# Patient Record
Sex: Male | Born: 1935 | Race: White | Hispanic: No | Marital: Married | Smoking: Never smoker
Health system: Southern US, Community
[De-identification: ages and names within clinical notes are randomized; demographics above are authoritative.]

## PROBLEM LIST (undated history)

## (undated) DIAGNOSIS — E119 Type 2 diabetes mellitus without complications: Secondary | ICD-10-CM

## (undated) DIAGNOSIS — I10 Essential (primary) hypertension: Secondary | ICD-10-CM

## (undated) DIAGNOSIS — E785 Hyperlipidemia, unspecified: Secondary | ICD-10-CM

## (undated) DIAGNOSIS — I2510 Atherosclerotic heart disease of native coronary artery without angina pectoris: Secondary | ICD-10-CM

## (undated) HISTORY — PX: CARDIAC SURGERY: SHX584

## (undated) HISTORY — PX: APPENDECTOMY: SHX54

## (undated) HISTORY — PX: INGUINAL HERNIA REPAIR: SHX194

---

## 1999-07-27 ENCOUNTER — Encounter: Payer: Self-pay | Admitting: Internal Medicine

## 1999-07-27 ENCOUNTER — Encounter: Admission: RE | Admit: 1999-07-27 | Discharge: 1999-07-27 | Payer: Self-pay | Admitting: Internal Medicine

## 2001-04-22 ENCOUNTER — Ambulatory Visit: Admission: RE | Admit: 2001-04-22 | Discharge: 2001-04-22 | Payer: Self-pay | Admitting: Specialist

## 2001-04-22 ENCOUNTER — Encounter: Payer: Self-pay | Admitting: Specialist

## 2001-10-26 ENCOUNTER — Encounter (INDEPENDENT_AMBULATORY_CARE_PROVIDER_SITE_OTHER): Payer: Self-pay | Admitting: *Deleted

## 2001-10-26 ENCOUNTER — Ambulatory Visit (HOSPITAL_BASED_OUTPATIENT_CLINIC_OR_DEPARTMENT_OTHER): Admission: RE | Admit: 2001-10-26 | Discharge: 2001-10-26 | Payer: Self-pay | Admitting: Otolaryngology

## 2002-06-04 ENCOUNTER — Encounter: Admission: RE | Admit: 2002-06-04 | Discharge: 2002-06-04 | Payer: Self-pay | Admitting: Gastroenterology

## 2002-06-04 ENCOUNTER — Encounter: Payer: Self-pay | Admitting: Gastroenterology

## 2002-06-28 ENCOUNTER — Encounter: Payer: Self-pay | Admitting: Gastroenterology

## 2002-06-28 ENCOUNTER — Ambulatory Visit (HOSPITAL_COMMUNITY): Admission: RE | Admit: 2002-06-28 | Discharge: 2002-06-28 | Payer: Self-pay | Admitting: Gastroenterology

## 2002-08-06 ENCOUNTER — Encounter: Payer: Self-pay | Admitting: Emergency Medicine

## 2002-08-06 ENCOUNTER — Emergency Department (HOSPITAL_COMMUNITY): Admission: EM | Admit: 2002-08-06 | Discharge: 2002-08-06 | Payer: Self-pay | Admitting: Physical Therapy

## 2003-05-15 ENCOUNTER — Inpatient Hospital Stay (HOSPITAL_COMMUNITY): Admission: EM | Admit: 2003-05-15 | Discharge: 2003-05-19 | Payer: Self-pay | Admitting: Emergency Medicine

## 2003-06-13 ENCOUNTER — Encounter (HOSPITAL_COMMUNITY): Admission: RE | Admit: 2003-06-13 | Discharge: 2003-09-11 | Payer: Self-pay | Admitting: Interventional Cardiology

## 2004-02-01 ENCOUNTER — Ambulatory Visit: Payer: Self-pay | Admitting: Cardiology

## 2004-05-02 ENCOUNTER — Ambulatory Visit: Payer: Self-pay | Admitting: Cardiology

## 2004-08-01 ENCOUNTER — Ambulatory Visit: Payer: Self-pay | Admitting: Cardiology

## 2005-01-30 ENCOUNTER — Ambulatory Visit (HOSPITAL_COMMUNITY): Admission: RE | Admit: 2005-01-30 | Discharge: 2005-01-30 | Payer: Self-pay | Admitting: Interventional Cardiology

## 2009-07-12 ENCOUNTER — Ambulatory Visit (HOSPITAL_COMMUNITY): Admission: RE | Admit: 2009-07-12 | Discharge: 2009-07-12 | Payer: Self-pay | Admitting: General Surgery

## 2010-03-25 LAB — DIFFERENTIAL
Basophils Absolute: 0 10*3/uL (ref 0.0–0.1)
Basophils Relative: 0 % (ref 0–1)
Monocytes Absolute: 0.7 10*3/uL (ref 0.1–1.0)
Monocytes Relative: 7 % (ref 3–12)
Neutro Abs: 6.9 10*3/uL (ref 1.7–7.7)
Neutrophils Relative %: 70 % (ref 43–77)

## 2010-03-25 LAB — URINALYSIS, ROUTINE W REFLEX MICROSCOPIC
Nitrite: NEGATIVE
Urobilinogen, UA: 1 mg/dL (ref 0.0–1.0)

## 2010-03-25 LAB — GLUCOSE, CAPILLARY
Glucose-Capillary: 148 mg/dL — ABNORMAL HIGH (ref 70–99)
Glucose-Capillary: 176 mg/dL — ABNORMAL HIGH (ref 70–99)

## 2010-03-25 LAB — SURGICAL PCR SCREEN
MRSA, PCR: NEGATIVE
Staphylococcus aureus: NEGATIVE

## 2010-03-25 LAB — CBC
HCT: 46.9 % (ref 39.0–52.0)
Hemoglobin: 16.4 g/dL (ref 13.0–17.0)
MCHC: 34.9 g/dL (ref 30.0–36.0)
MCV: 92.1 fL (ref 78.0–100.0)
RBC: 5.09 MIL/uL (ref 4.22–5.81)
RDW: 13.9 % (ref 11.5–15.5)

## 2010-03-25 LAB — COMPREHENSIVE METABOLIC PANEL
ALT: 27 U/L (ref 0–53)
AST: 22 U/L (ref 0–37)
BUN: 13 mg/dL (ref 6–23)
Calcium: 9.6 mg/dL (ref 8.4–10.5)
Chloride: 101 mEq/L (ref 96–112)
Creatinine, Ser: 0.87 mg/dL (ref 0.4–1.5)
GFR calc non Af Amer: >60 mL/min (ref >60)
Potassium: 4.2 mEq/L (ref 3.5–5.1)
Total Bilirubin: 1.2 mg/dL (ref 0.3–1.2)

## 2010-05-25 NOTE — Cardiovascular Report (Signed)
NAME:  Alan Miranda, Alan Miranda                              ACCOUNT NO.:  [id number]   MEDICAL RECORD NO.:  [id number]                   PATIENT TYPE:  INP   LOCATION:  2912                                 FACILITY:  [facility]   PHYSICIAN:  Lesleigh Noe, M.D.            DATE OF BIRTH:  [birth year]   DATE OF PROCEDURE:  05/16/2003  DATE OF DISCHARGE:                              CARDIAC CATHETERIZATION   INDICATIONS FOR PROCEDURE:  Recent anterior Q-wave myocardial infarction  within the past 72 hours, persistent ongoing chest discomfort.   DATE OF PROCEDURE:  May 16, 2003.   PROCEDURE PERFORMED:  1. Left heart catheterization.  2. Selective coronary angiography.  3. Left ventriculography.  4. Stent LAD with Cypher drug-eluting stent.   DESCRIPTION:  After informed consent, a 6-French sheath was placed in the  right femoral artery using modified Seldinger technique.  A 6-French A2  multipurpose catheter was used for left ventriculography by hand injection,  selective right coronary angiography and hemodynamic recordings.  A 4 left  Judkins catheter was used for left coronary angiography.  We identified  total occlusion of the mid LAD after a large first diagonal and there was  faint right-to-left and left-to-left collaterals to the distal LAD  circulatory bed.  The ventriculogram demonstrated essential akinesis of the  apex.  With mild ongoing chest pain, we decided to intervene on the LAD.   We ultimately used a 4.0 Q-shaped 6-French left coronary guide catheter, and  IV integrilin double bolus followed by an infusion.  We also used 5000 units  of IV heparin to document an ACT greater than 250 seconds.   We had difficulty crossing the total occlusion in the LAD.  An Asahi soft  wire was initially used.  Then, an Office manager was used.  We were  eventually able to cross into a diagonal beyond the region of total  occlusion.  We were never able to get into the main channel of the LAD.   We  did a 2.0 mm diameter balloon inflation within the region of the diagonal  LAD bifurcation and this essentially opened the vessel and gave [place] minimal  flow down the LAD.  We were then easily able to reposition the wire distally  in the LAD.  We predilated with the 2.0 balloon, and then were able to stent  the LAD with an 18 x 3.0 mm Cypher stent/balloon system to 14 atmospheres.  Two balloon inflations were performed.  Antegrade TIMI-3 flow was noted post  intervention.  The patient tolerated the procedure without complications.   Post procedure Angio-Seal arteriotomy closure was performed after  visualization of the right femoral/iliac demonstrating adequate access entry  site.  Good hemostasis was achieved.   RESULTS:   I. HEMODYNAMIC DATA:  A.  Aortic pressure 104/70.  B.  Left ventricular pressure 104/14.   II. LEFT VENTRICULOGRAPHY:  Apical  akinesis.  EF 50%.   III. CORONARY ANGIOGRAPHY:  A.  Left main coronary:  Normal.  B.  Left anterior descending coronary:  Totally occluded after the first  diagonal.  First diagonal contains 60% mid vessel stenosis.  C.  Circumflex artery:  One dominant obtuse marginal arises from the  circumflex.  There is 40% mid vessel stenosis.  D.  Right coronary:  There is 20-30% narrowing in the mid and distal  coronary.  PDA is large.  Collaterals to the LAD are noted from the PDA,  particularly through a mid septal perforator.   IV. PERCUTANEOUS CORONARY INTERVENTION:  The LAD is 100% occluded after the  first diagonal.  After percutaneous intervention and stenting with 18 x 3.0  Cypher, the lesion in the LAD was reduced to 0% and there was TIMI-3 flow.   CONCLUSIONS:  1. Recent anteroapical myocardial infarction with left ventricular ejection     fraction of 40-50%.  2. 100% mid left anterior descending reduced to 0% following successful     stent with Cypher drug-eluting stent.  The left anterior descending was     collateralized by  left-to-right and right-to-left collaterals.  3. Luminal irregularities with up to 40% stenosis noted in the right     coronary and 60% stenosis noted in the first obtuse marginal.   PLAN:  1. Aspirin.  2. The patient is enrolled in [place].  3. Integrilin x 12 hours.  4. Hopeful discharge within the next 48 hours.                                               Lesleigh Noe, M.D.    HWS/MEDQ  D:  05/16/2003  T:  05/16/2003  Job:  [id number]   cc:   Theressa Millard, M.D.  [address] [place]  [place] [address]  Fax: [phone]

## 2010-05-25 NOTE — Op Note (Signed)
   NAME:  Alan Miranda, Alan Miranda                              ACCOUNT NO.:  [id number]   MEDICAL RECORD NO.:  [id number]                   PATIENT TYPE:  AMB   LOCATION:  DSC                                  FACILITY:  [facility]   PHYSICIAN:  Christopher E. Ezzard Standing, M.D.         DATE OF BIRTH:  1934/02/29   DATE OF PROCEDURE:  10/26/2001  DATE OF DISCHARGE:                                 OPERATIVE REPORT   PREOPERATIVE DIAGNOSES:  Left buccal mass (2.0 cm in size).   POSTOPERATIVE DIAGNOSES:  Left buccal mas (2.0 cm in size).   OPERATION:  Excision of left buccal mass with a simple closure.   SURGEON:  Kristine Garbe. Ezzard Standing, M.D.   ANESTHESIA:  Local 1% Xylocaine with 1:100,000 epinephrine.   COMPLICATIONS:  None.   INDICATIONS FOR PROCEDURE:  The patient is a 76 year old gentleman who has  had a left buccal mass for several years.  It has gradually gotten a little  bit larger.  It interferes when he chews.  He says that he occasionally  bites it.  On examination he has a smooth mucosal polypoid mass arising just  inferior to the left parotid duct.  It measures approximately 2.0 cm in  size.  He is taken to the operating room at this time for an excision under  local anesthetic.   DESCRIPTION OF PROCEDURE:  The area was injected with 2 cc of Xylocaine with  epinephrine.  The mass was excised at its base.  It was sent to pathology.  Silver nitrate was applied for hemostasis.  The defect was closed with three  interrupted #5-0 chromic sutures.  The excision site was 0.5 cm below the  parotid duct, which was visible and tightened.   DISPOSITION:  The patient is discharged home.  The patient will call our  office in three to four days concerning the results of the pathology.  I  instructed the patient to use Tylenol p.r.n. for pain.                                                Kristine Garbe. Ezzard Standing, M.D.    CEN/MEDQ  D:  10/26/2001  T:  10/26/2001  Job:  [id number]

## 2010-05-25 NOTE — H&P (Signed)
NAME:  Alan Miranda, Alan Miranda                              ACCOUNT NO.:  [id number]   MEDICAL RECORD NO.:  [id number]                   PATIENT TYPE:  EMS   LOCATION:  MAJO                                 FACILITY:  [facility]   PHYSICIAN:  Cassell Clement, M.D.              DATE OF BIRTH:  1934-04-26   DATE OF ADMISSION:  05/15/2003  DATE OF DISCHARGE:                                HISTORY & PHYSICAL   CHIEF COMPLAINT:  Chest pain.   HISTORY:  This is a 76 year old Caucasian male without prior known history  of heart disease. He was admitted with a history of frequent chest pains  over the past one to two weeks. He had his worse spell last night after  supper. He has a history of known reflux and has had esophageal strictures  dilated by  Dr. Danise Edge and he had been attributing his symptoms of  indigestion to his hiatal hernia and reflux. Last night he had substernal  pressure as well as epigastric fullness. He was noted to have pallor,  nausea, and diaphoresis. There was no left arm radiation. He does have  occasional radiation of discomfort in the right arm. Today he continued not  to feel well. He did not go to church, which is quite unusual for him not to  go to church. His family convinced him to come to the emergency room to be  checked and in the emergency room he was initially being evaluated for  possible gallbladder and gallbladder ultrasound had been requested, but then  the electrocardiogram came back showing Q-waves in the inferior leads and in  leads v3 and v4 and point of care enzymes returned markedly elevated and it  was recognized that these symptoms were probably a cardiac rather than  gastric. He has noted in retrospect that twice within the past week or two  he went to play golf and had to quit after just three or four holes because  of noted chest tightness. He did not seek any medical help for these  symptoms, however. The patient does have a history of borderline  hypertension and has been on low-dose hydrochlorothiazide 25 mg once a day  and he states his cholesterol has never been a problem, but he has had  elevated triglycerides. He is not diabetic.   FAMILY HISTORY:  His mother is still living at age 41, but has had a  coronary stent. Father died at 83 of emphysema and an aneurysm in the chest,  but no clear as to whether it was cardiac or aortic. He has a brother who is  living and has had a small heart attack.   SOCIAL HISTORY:  Retired from [employer] in 1991. He is a nonsmoker, but does  chew tobacco. He does not use alcohol.   ALLERGIES:  Tetracycline.   PAST SURGICAL HISTORY:  He has had a right inguinal  herniorrhaphy,  appendectomy, and he has had kidney stones.   PRESENT MEDICATIONS AT HOME:  1. Clarinex 5 mg daily.  2. Prilosec once a day.  3. Xanax 0.25 mg p.r.n. anxiety.  4. Ibuprofen p.r.n. arthritis.  5. Hydrochlorothiazide 25 mg daily.  6. He does not take any regular aspirin, but did take some aspirin last     night when he had his symptoms.   REVIEW OF SYSTEMS:  He has recently been involved in an evaluation of  vertigo initially by Dr. Lovey Newcomer and then by Dr. Kelli Hope and more  recently by the neurosurgeon, Dr. Venetia Maxon. No surgery is contemplated  immediately, but they have spoken to him about going to a rehab program for  vertigo. GASTROINTESTINAL:  Frequent indigestion.  GENITOURINARY:  Negative.  RESPIRATORY:  Occasional cough with some __________.   PHYSICAL EXAMINATION:  VITAL SIGNS:  Blood pressure 130/61, pulse 69 and  regular, respirations normal.  GENERAL:  He is a well-developed, well-nourished white male in no distress  and not having any significant pain now. He is lying flat and comfortable.  SKIN:  Warm and dry. Color was good.  HEENT:  The pupils are equal and reactive. Sclerae clear. Mouth and pharynx  normal.  NECK:  Carotids normal. Jugular vein pressure normal. No lymphadenopathy.  Thyroid not  enlarged.  CHEST:  Clear to auscultation.  The heart reveals a quiet precardium without  murmur, gallop, rub, or click.  ABDOMEN:  Soft and nontender.  EXTREMITIES:  No phlebitis or edema. He has good peripheral pulses.   LABORATORY DATA:  Chest x-ray shows no active disease. EKG shows normal  sinus rhythm at 74 per minute. He has Q-waves in leads 2, 3, and AVF  consistent with an old inferior wall MI. He also has a QS pattern in v3, v4,  consistent with anterior wall MI of uncertain age.   Initial labs:  Hemoglobin 17.5, white count 16,000 with 83% polys. Point of  care CK-MB is greater than 80. Point of care troponin I is greater than 30.  Point of care myoglobin is 368. His SGOT is 207, SGPT is 69. Alkaline  phosphatase is normal at 58. Bilirubin 1.4. Sodium 137, potassium 3.5, BUN  9, creatinine 0.9.   IMPRESSION:  1. Ischemic heart disease with recent myocardial infarction by enzymes. EKG     suggests old inferior MI and also anterior MI of uncertain age. This is     superimposed upon a history of recent crescendo angina for the past     several weeks.  2. Essential hypertension.  3. Tobacco usage (chewing tobacco).  4. GERD.  5. History of anxiety.   DISPOSITION:  We are admitting to Dr. Verdis Prime to a telemetry bed. We  will get serial enzymes and EKGs. We will treat with IV heparin, IV  nitroglycerin, Lopressor, Lipitor, aspirin. We will replete his potassium.  Anticipate probable cardiac catheterization Monday by Dr. Verdis Prime.                                                Cassell Clement, M.D.    TB/MEDQ  D:  05/15/2003  T:  05/15/2003  Job:  [id number]   cc:   Theressa Millard, M.D.  [address] [place]  [place] [address]  Fax: [phone]   Lyn Records III,  M.D.  [address]  [place]  [place] [address]  Fax: [phone]

## 2010-05-25 NOTE — Discharge Summary (Signed)
NAME:  Alan Miranda, Alan Miranda                              ACCOUNT NO.:  [id number]   MEDICAL RECORD NO.:  [id number]                   PATIENT TYPE:  INP   LOCATION:  2017                                 FACILITY:  [facility]   PHYSICIAN:  Revonda Standard L. Rennis Harding, N.P.              DATE OF BIRTH:  [birth year]   DATE OF ADMISSION:  05/15/2003  DATE OF DISCHARGE:  05/19/2003                                 DISCHARGE SUMMARY   CHIEF COMPLAINT/REASON FOR ADMISSION:  Mr. Sebring is a 76 year old male  without any prior history of known cardiac disease who complained of a  history of frequent chest pain over one to two weeks, significant spells the  evening before admission after dinner. He has a known history of reflux and  esophageal strictures, dilated by Dr. Laural Benes, and has been attributing  these chest discomfort symptoms to GI etiology. He began having substernal  chest pressure as well as epigastric fullness with the previous night's  chest discomfort and he also began to have new symptoms of pallor, nausea,  and diaphoresis. There was no radicular pain from the chest to the left arm,  although he does have occasional radiation or discomfort to the right arm.  He felt poorly in that he was unable to go to the church on the date of  admission.  Because of this the family became concerned and convinced him to  come to the emergency department. He was initially evaluated for possible  gallbladder disease, but routine EKG came back showing Q-waves in the  inferior leads as well as in V3 and V4 with point of care enzymes returning  markedly elevated. The patient also noted in retrospect that over the past  week or two when he went to play golf he had to quit after three to four  holes because of chest tightness.  He does have a history of hypertension on  low-dose hydrochlorothiazide. He states that his cholesterol has been okay,  but his triglycerides are elevated.   FAMILY HISTORY:  Significant for mother with a  coronary stent; she is 37.  Father without any coronary disease. A brother who has had a heart attack.   On initial evaluation the patient's vital signs were stable. Blood pressure  130/61, pulse 69 and regular, respirations normal and nonlabored. Physical  examination was otherwise normal.  Chest x-ray was normal. EKG showed sinus  rhythm, Q-waves in leads II through aVF consistent with old inferior wall MI  as well as a QS pattern in V3 and V4 consistent with anterior wall MI of  uncertain age. Initial hemoglobin 17.5, white count 16,000. Point of care CK  greater than 80, point of care troponin-I greater than 30, point of care  myoglobin 368.  SGOT 207, SGPT 69, alkaline phosphatase normal at 58, total  bilirubin 1.4, sodium 137, potassium 3.5, BUN 9, creatinine 0.9.   The patient  was admitted by Dr. Patty Sermons with the following diagnoses: (1)  Acute MI with evidence of old inferior MI per EKG. (2) Elevated cardiac  isoenzymes suggesting that MI has been in progress greater than eight hours.  (3) Recent crescendo angina for several weeks. (4) Essential hypertension.  (5) Oral tobacco use. (6) GERD. (7) History of anxiety.   HOSPITAL COURSE:   PROBLEM #1:  Acute MI. The patient was admitted to a general telemetry unit  where he was started on aspirin, heparin, and beta blockers as well as IV  nitroglycerin. His potassium was somewhat low as noted by Dr. Patty Sermons, so  the potassium was repleted orally. He was empirically placed on a cath  schedule per Dr. Katrinka Blazing for the following morning. He also spiked a  temperature during the night of 101.4 and although this could be related to  his recent MI, Dr. Katrinka Blazing also wanted to rule out any infectious process. He  did check a urinalysis and culture.  The patient later that day, on May 16, 2003, was taken to the cath lab per Dr. Katrinka Blazing. The LAD was noted to be 100%  occluded after the diagonal-1. The diagonal-1 was 60% occluded. The   circumflex at the OM-1 was 40% mid lesion. The RCA had a 20% to 30% mid  lesion with a dominant vessel. The patient subsequently underwent PCI of the  LAD with placement of a Cypher stent with AngioSeal to the right groin post  procedure. Good hemostasis. No hematoma. No problems in the immediate post  period.  Ventriculography revealed an EF of 45% to 55% and evidence of  anteroapical MI. The patient was also started on the Triton study drug and  aspirin was continued in the immediate post catheterization period. The  following day he continued to have some chest soreness, but no true cardiac  chest pain as previously experienced. He had some bruising at the right  groin cath site; otherwise, cath site was negative. He was also experiencing  some cough, but chest x-ray was negative for CHF or pneumonia, and there was  some concern with the fever that the patient may also be experiencing some  pericarditis, but he was not having any typical EKG changes with that. By  May 18, 2003, the patient was feeling nervous and jittery. He did not sleep  well. Blood pressure was stable. He continued to run low-grade fevers of  100.2, which was his T-max.  Due to his anxiety, Dr. Katrinka Blazing started some  Xanax and cardiac rehab was initiated. By May 19, 2003, the patient's fever  had resolved. He had no more chest pain and he was deemed stable for  discharge.   PROBLEM #2:  Fever. Again, routine workup of fever revealed no infectious  etiology.  Chest x-ray was negative.  Urinalysis was normal and EKG was  negative for any evidence of acute pericarditis and chest pain, and fever  resolved prior to discharge.   PROBLEM #3:  Dyslipidemia. The patient did have a fasting lipid panel drawn  on admission, but I am unable to locate these results on the chart at this  time.   Laboratory values this hospitalization include repeat cardiac isoenzymes as follows: Troponin 65.52, CK 1045, MB 38.  Last CBC was on May 17, 2003,  hemoglobin 13.7, hematocrit 39.5, white count 12,700, platelet count  250,000. BMET revealed sodium of 134, potassium 3.8, chloride 99, CO2 28,  BUN 13, creatinine 1.0, glucose 126.   FINAL  DISCHARGE DIAGNOSES:  1. Status post acute anterior myocardial infarction, status post PCI and     Cypher stent to the left anterior descending.  2. History of remote inferior myocardial infarction as evidenced by     electrocardiogram changes.  3. Dyslipidemia.  4. Gastroesophageal reflux disease.  5. Fever probably secondary to myocardial infarction.  6. Known elevated triglycerides, currently on Lipitor. Please refer to     dyslipidemia.  7. Hypertension, controlled.   DISCHARGE MEDICATIONS:  1. Triton study two tablets every morning.  2. Stop hydrochlorothiazide.  3. Altace 2.5 mg daily.  4. Lopressor 25 mg b.i.d.  5. Aspirin 81 mg daily.  6. Lipitor 80 mg daily.  7. Protonix, Claritin, and Xanax as before.   ACTIVITY:  Begin phase II cardiac rehab.   DIET:  Cardiac.   SPECIAL INSTRUCTIONS:  Do not take open label Plavix while taking Triton  Study drug. The research staff will call the patient to schedule first  visit.   FOLLOWUP APPOINTMENT:  With Dr. Katrinka Blazing on Wednesday, Jun 01, 2003, at 10:15  a.m.; fasting statin panel which means do not eat or drink anything after  midnight the night labs on Thursday, June 30, 2003. At 7:30 a.m.                                                Allison L. Rennis Harding, N.P.    ALE/MEDQ  D:  06/15/2003  T:  06/16/2003  Job:  [id number]   cc:   Theressa Millard, M.D.  [address] [place]  [place] [address]  Fax: [phone]

## 2010-05-25 NOTE — Op Note (Signed)
NAME:  Alan Miranda, Alan Miranda                              ACCOUNT NO.:  [id number]   MEDICAL RECORD NO.:  [id number]                   PATIENT TYPE:  AMB   LOCATION:  ENDO                                 [facility]ACILITY[facility]   PHYSICIAN:  Danise Edge, M.D.                DATE OF BIRTH:  1934-11-23   DATE OF PROCEDURE:  06/28/2002  DATE OF DISCHARGE:                                 OPERATIVE REPORT   PROCEDURE:  Esophagogastroduodenoscopy with Savary esophageal dilation.   PROCEDURE INDICATION:  Mr. Alan Miranda is a 76 year old male born December 23, 1934.  Mr. Read has chronic gastroesophageal reflux associated with a hiatal  hernia documented by barium swallow x-ray.  He has mild odynophagia and  solid food dysphagia.  He denies vomiting or gastrointestinal bleeding.   CHRONIC MEDICATIONS:  Xanax, multivitamin, Advil, Clarinex,  hydrochlorothiazide, Astelin, Zantac, Prilosec.   PAST MEDICAL HISTORY:  1. Allergic rhinitis.  2. Agoraphobia.  3. Gastroesophageal reflux disease associated with a hiatal hernia.  4. Mucocele, left cheek.  5. Arthroscopic knee surgery.  6. Hypertension.  7. Left inguinal hernia.  8. Right inguinal hernia repair.  9. Appendectomy.  10.      Kidney stones.   ENDOSCOPIST:  Danise Edge, M.D.   PREMEDICATION:  Versed 10 mg, Demerol 50 mg.   PROCEDURE:  After obtaining informed consent, [name] was placed in the  left lateral decubitus position.  I administered intravenous Demerol and  intravenous Versed to achieve conscious sedation for the procedure.  The  patient's blood pressure, oxygen saturation, and cardiac rhythm were  monitored throughout the procedure and documented in the medical record.   The Olympus gastroscope was passed through the posterior hypopharynx into  the proximal esophagus without difficulty.  The hypopharynx, larynx, and  vocal cords appeared normal.   Esophagoscopy:  The proximal and midsegments of the esophageal mucosa  appeared  normal.  There is a benign-appearing stricture at the  esophagogastric junction unassociated with erosive esophagitis or Barrett's  esophagus.   Gastroscopy:  There is a small hiatal hernia.  Retroflexed view of the  gastric cardia and fundus was normal.  The diaphragmatic hiatus is quite  patulous.  The gastric body, antrum, and pylorus appear normal.   Duodenoscopy:  The duodenal bulb, mid-duodenum, and distal duodenum appear  normal.   Savary esophageal dilation:  The Savary dilator wire was passed through the  endoscope and the tip of the guidewire advanced to the distal gastric  antrum, as confirmed endoscopically and fluoroscopically.  Under  fluoroscopic guidance a 15 mm Savary dilator passed without resistance.  Repeat esophagogastroscopy confirmed satisfactory dilation of the benign  peptic stricture at the esophagogastric junction and no endoscopic evidence  for the presence of gastric trauma secondary to the guidewire.   ASSESSMENT:  Chronic gastroesophageal reflux associated with a hiatal hernia  and benign peptic stricture at  the esophagogastric junction, dilated with a  15 mm Savary dilator.   RECOMMENDATIONS:  1. Continue Prilosec 20 mg each morning and p.r.n. Zantac in the evening.  2. Repeat esophageal dilation as needed.                                               Danise Edge, M.D.    MJ/MEDQ  D:  06/28/2002  T:  06/29/2002  Job:  [id number]

## 2015-09-20 ENCOUNTER — Other Ambulatory Visit: Payer: Self-pay

## 2015-09-20 ENCOUNTER — Other Ambulatory Visit: Payer: Self-pay | Admitting: Interventional Cardiology

## 2015-09-20 ENCOUNTER — Telehealth: Payer: Self-pay

## 2015-09-20 DIAGNOSIS — I5020 Unspecified systolic (congestive) heart failure: Secondary | ICD-10-CM

## 2015-09-20 DIAGNOSIS — R110 Nausea: Secondary | ICD-10-CM

## 2015-09-20 DIAGNOSIS — R630 Anorexia: Secondary | ICD-10-CM

## 2015-09-20 DIAGNOSIS — M7989 Other specified soft tissue disorders: Secondary | ICD-10-CM

## 2015-09-20 DIAGNOSIS — I2510 Atherosclerotic heart disease of native coronary artery without angina pectoris: Secondary | ICD-10-CM

## 2015-09-20 NOTE — Telephone Encounter (Signed)
Dr. Chales SalmonVaradarajan with [facility] called and talked to Dr. Eldridge DaceVaranasi (DOD) about this patient today. This patient is an old patient of Dr. Michaelle CopasSmith's.  He has a history of CAD and CHF. Patient has extreme swelling in bilateral lower extremities. Patient needs an appointment to see Dr. Katrinka BlazingSmith or APP within the next week. Per Dr. Eldridge DaceVaranasi, patient needs an echo ASAP and a BMET when he comes in for Echo. Order has been put in for echo. Will send message for scheduling to call to set up appointments. Put patient on Dr. Michaelle CopasSmith's schedule on 09/26/15 for an office visit. Patient needs to be informed of this appointment and schedule the echo before this appointment.  Left message for patient to call back. Will send to Triage to follow-up with tomorrow.

## 2015-09-21 ENCOUNTER — Ambulatory Visit
Admission: RE | Admit: 2015-09-21 | Discharge: 2015-09-21 | Disposition: A | Discharge status: Home / Self Care | Payer: 59 | Source: Ambulatory Visit | Attending: Internal Medicine | Admitting: Internal Medicine

## 2015-09-21 DIAGNOSIS — R110 Nausea: Secondary | ICD-10-CM

## 2015-09-21 DIAGNOSIS — R630 Anorexia: Secondary | ICD-10-CM

## 2015-09-21 NOTE — Telephone Encounter (Signed)
I left a message for the patient to call at his home/ cell #'s. 

## 2015-09-21 NOTE — Telephone Encounter (Signed)
Left a message for the pt to call back.  

## 2015-09-21 NOTE — Telephone Encounter (Signed)
Follow up  ° ° ° °Patient returning call back to triage.  °

## 2015-09-22 ENCOUNTER — Other Ambulatory Visit: Payer: Self-pay | Admitting: Internal Medicine

## 2015-09-22 DIAGNOSIS — K769 Liver disease, unspecified: Secondary | ICD-10-CM

## 2015-09-22 NOTE — Telephone Encounter (Signed)
Called patient back. Patient is aware of his appointments.

## 2015-09-25 ENCOUNTER — Inpatient Hospital Stay (HOSPITAL_COMMUNITY)
Admission: EM | Admit: 2015-09-25 | Discharge: 2015-10-08 | DRG: 871 | Disposition: E | Payer: Medicare Other | Attending: Family Medicine | Admitting: Family Medicine

## 2015-09-25 ENCOUNTER — Telehealth: Payer: Self-pay | Admitting: Interventional Cardiology

## 2015-09-25 ENCOUNTER — Other Ambulatory Visit: Payer: 59 | Admitting: *Deleted

## 2015-09-25 ENCOUNTER — Inpatient Hospital Stay (HOSPITAL_COMMUNITY): Payer: Medicare Other

## 2015-09-25 ENCOUNTER — Emergency Department (HOSPITAL_COMMUNITY): Payer: Medicare Other

## 2015-09-25 ENCOUNTER — Other Ambulatory Visit (HOSPITAL_COMMUNITY): Payer: Self-pay

## 2015-09-25 ENCOUNTER — Other Ambulatory Visit: Payer: Self-pay

## 2015-09-25 ENCOUNTER — Ambulatory Visit (HOSPITAL_COMMUNITY): Payer: Medicare Other | Attending: Cardiology

## 2015-09-25 ENCOUNTER — Encounter (HOSPITAL_COMMUNITY): Payer: Self-pay | Admitting: Emergency Medicine

## 2015-09-25 DIAGNOSIS — E871 Hypo-osmolality and hyponatremia: Secondary | ICD-10-CM | POA: Diagnosis present

## 2015-09-25 DIAGNOSIS — D689 Coagulation defect, unspecified: Secondary | ICD-10-CM | POA: Diagnosis present

## 2015-09-25 DIAGNOSIS — Z66 Do not resuscitate: Secondary | ICD-10-CM | POA: Diagnosis present

## 2015-09-25 DIAGNOSIS — I110 Hypertensive heart disease with heart failure: Secondary | ICD-10-CM | POA: Diagnosis present

## 2015-09-25 DIAGNOSIS — K767 Hepatorenal syndrome: Secondary | ICD-10-CM | POA: Diagnosis present

## 2015-09-25 DIAGNOSIS — R6520 Severe sepsis without septic shock: Secondary | ICD-10-CM | POA: Diagnosis present

## 2015-09-25 DIAGNOSIS — I5043 Acute on chronic combined systolic (congestive) and diastolic (congestive) heart failure: Secondary | ICD-10-CM | POA: Diagnosis present

## 2015-09-25 DIAGNOSIS — Z7984 Long term (current) use of oral hypoglycemic drugs: Secondary | ICD-10-CM

## 2015-09-25 DIAGNOSIS — K8080 Other cholelithiasis without obstruction: Secondary | ICD-10-CM | POA: Diagnosis present

## 2015-09-25 DIAGNOSIS — F419 Anxiety disorder, unspecified: Secondary | ICD-10-CM | POA: Diagnosis present

## 2015-09-25 DIAGNOSIS — G9341 Metabolic encephalopathy: Secondary | ICD-10-CM | POA: Diagnosis present

## 2015-09-25 DIAGNOSIS — I5020 Unspecified systolic (congestive) heart failure: Secondary | ICD-10-CM

## 2015-09-25 DIAGNOSIS — R634 Abnormal weight loss: Secondary | ICD-10-CM | POA: Diagnosis present

## 2015-09-25 DIAGNOSIS — M7989 Other specified soft tissue disorders: Secondary | ICD-10-CM | POA: Diagnosis not present

## 2015-09-25 DIAGNOSIS — Z79899 Other long term (current) drug therapy: Secondary | ICD-10-CM

## 2015-09-25 DIAGNOSIS — I2510 Atherosclerotic heart disease of native coronary artery without angina pectoris: Secondary | ICD-10-CM

## 2015-09-25 DIAGNOSIS — I469 Cardiac arrest, cause unspecified: Secondary | ICD-10-CM | POA: Diagnosis not present

## 2015-09-25 DIAGNOSIS — Z955 Presence of coronary angioplasty implant and graft: Secondary | ICD-10-CM | POA: Diagnosis not present

## 2015-09-25 DIAGNOSIS — I959 Hypotension, unspecified: Secondary | ICD-10-CM | POA: Diagnosis not present

## 2015-09-25 DIAGNOSIS — R160 Hepatomegaly, not elsewhere classified: Secondary | ICD-10-CM | POA: Diagnosis present

## 2015-09-25 DIAGNOSIS — I248 Other forms of acute ischemic heart disease: Secondary | ICD-10-CM | POA: Diagnosis present

## 2015-09-25 DIAGNOSIS — I2583 Coronary atherosclerosis due to lipid rich plaque: Secondary | ICD-10-CM | POA: Diagnosis present

## 2015-09-25 DIAGNOSIS — I5022 Chronic systolic (congestive) heart failure: Secondary | ICD-10-CM | POA: Insufficient documentation

## 2015-09-25 DIAGNOSIS — K7200 Acute and subacute hepatic failure without coma: Secondary | ICD-10-CM | POA: Diagnosis present

## 2015-09-25 DIAGNOSIS — A419 Sepsis, unspecified organism: Principal | ICD-10-CM | POA: Diagnosis present

## 2015-09-25 DIAGNOSIS — E872 Acidosis: Secondary | ICD-10-CM | POA: Diagnosis not present

## 2015-09-25 DIAGNOSIS — E785 Hyperlipidemia, unspecified: Secondary | ICD-10-CM | POA: Diagnosis present

## 2015-09-25 DIAGNOSIS — E875 Hyperkalemia: Secondary | ICD-10-CM | POA: Diagnosis not present

## 2015-09-25 DIAGNOSIS — R630 Anorexia: Secondary | ICD-10-CM | POA: Diagnosis present

## 2015-09-25 DIAGNOSIS — E119 Type 2 diabetes mellitus without complications: Secondary | ICD-10-CM | POA: Diagnosis not present

## 2015-09-25 DIAGNOSIS — R0600 Dyspnea, unspecified: Secondary | ICD-10-CM

## 2015-09-25 DIAGNOSIS — E860 Dehydration: Secondary | ICD-10-CM | POA: Diagnosis present

## 2015-09-25 DIAGNOSIS — Z881 Allergy status to other antibiotic agents status: Secondary | ICD-10-CM

## 2015-09-25 DIAGNOSIS — N171 Acute kidney failure with acute cortical necrosis: Secondary | ICD-10-CM | POA: Diagnosis not present

## 2015-09-25 DIAGNOSIS — I340 Nonrheumatic mitral (valve) insufficiency: Secondary | ICD-10-CM | POA: Insufficient documentation

## 2015-09-25 DIAGNOSIS — K7290 Hepatic failure, unspecified without coma: Secondary | ICD-10-CM

## 2015-09-25 DIAGNOSIS — I10 Essential (primary) hypertension: Secondary | ICD-10-CM | POA: Diagnosis present

## 2015-09-25 DIAGNOSIS — B377 Candidal sepsis: Secondary | ICD-10-CM

## 2015-09-25 DIAGNOSIS — J9601 Acute respiratory failure with hypoxia: Secondary | ICD-10-CM | POA: Diagnosis not present

## 2015-09-25 DIAGNOSIS — E162 Hypoglycemia, unspecified: Secondary | ICD-10-CM | POA: Diagnosis not present

## 2015-09-25 DIAGNOSIS — I071 Rheumatic tricuspid insufficiency: Secondary | ICD-10-CM | POA: Insufficient documentation

## 2015-09-25 DIAGNOSIS — N179 Acute kidney failure, unspecified: Secondary | ICD-10-CM | POA: Diagnosis present

## 2015-09-25 DIAGNOSIS — D696 Thrombocytopenia, unspecified: Secondary | ICD-10-CM | POA: Diagnosis present

## 2015-09-25 DIAGNOSIS — R740 Nonspecific elevation of levels of transaminase and lactic acid dehydrogenase [LDH]: Secondary | ICD-10-CM | POA: Diagnosis present

## 2015-09-25 DIAGNOSIS — J9690 Respiratory failure, unspecified, unspecified whether with hypoxia or hypercapnia: Secondary | ICD-10-CM

## 2015-09-25 DIAGNOSIS — E1100 Type 2 diabetes mellitus with hyperosmolarity without nonketotic hyperglycemic-hyperosmolar coma (NKHHC): Secondary | ICD-10-CM | POA: Diagnosis present

## 2015-09-25 DIAGNOSIS — G9340 Encephalopathy, unspecified: Secondary | ICD-10-CM | POA: Diagnosis not present

## 2015-09-25 DIAGNOSIS — Z9889 Other specified postprocedural states: Secondary | ICD-10-CM

## 2015-09-25 DIAGNOSIS — Z4659 Encounter for fitting and adjustment of other gastrointestinal appliance and device: Secondary | ICD-10-CM

## 2015-09-25 DIAGNOSIS — R188 Other ascites: Secondary | ICD-10-CM | POA: Diagnosis present

## 2015-09-25 HISTORY — DX: Type 2 diabetes mellitus without complications: E11.9

## 2015-09-25 HISTORY — DX: Essential (primary) hypertension: I10

## 2015-09-25 HISTORY — DX: Hyperlipidemia, unspecified: E78.5

## 2015-09-25 HISTORY — DX: Atherosclerotic heart disease of native coronary artery without angina pectoris: I25.10

## 2015-09-25 LAB — CBC WITH DIFFERENTIAL/PLATELET
BASOS ABS: 0 10*3/uL (ref 0.0–0.1)
Basophils Relative: 0 %
EOS ABS: 0 10*3/uL (ref 0.0–0.7)
EOS PCT: 0 %
HCT: 50.4 % (ref 39.0–52.0)
Hemoglobin: 16.2 g/dL (ref 13.0–17.0)
LYMPHS PCT: 10 %
Lymphs Abs: 1.9 10*3/uL (ref 0.7–4.0)
MCH: 28.3 pg (ref 26.0–34.0)
MCHC: 32.1 g/dL (ref 30.0–36.0)
MCV: 88 fL (ref 78.0–100.0)
Monocytes Absolute: 1.9 10*3/uL — ABNORMAL HIGH (ref 0.1–1.0)
Monocytes Relative: 10 %
Neutro Abs: 14.3 10*3/uL — ABNORMAL HIGH (ref 1.7–7.7)
Neutrophils Relative %: 80 %
PLATELETS: 160 10*3/uL (ref 150–400)
RBC: 5.73 MIL/uL (ref 4.22–5.81)
RDW: 15.1 % (ref 11.5–15.5)
WBC: 18.1 10*3/uL — AB (ref 4.0–10.5)

## 2015-09-25 LAB — URINE MICROSCOPIC-ADD ON
Bacteria, UA: NONE SEEN
RBC / HPF: NONE SEEN RBC/hpf (ref 0–5)

## 2015-09-25 LAB — GLUCOSE, CAPILLARY: GLUCOSE-CAPILLARY: 114 mg/dL — AB (ref 65–99)

## 2015-09-25 LAB — COMPREHENSIVE METABOLIC PANEL
ALT: 2910 U/L — ABNORMAL HIGH (ref 17–63)
AST: 3645 U/L — AB (ref 15–41)
Albumin: 3.6 g/dL (ref 3.5–5.0)
Alkaline Phosphatase: 298 U/L — ABNORMAL HIGH (ref 38–126)
Anion gap: 22 — ABNORMAL HIGH (ref 5–15)
BILIRUBIN TOTAL: 4.5 mg/dL — AB (ref 0.3–1.2)
BUN: 49 mg/dL — AB (ref 6–20)
CO2: 15 mmol/L — ABNORMAL LOW (ref 22–32)
Calcium: 9.1 mg/dL (ref 8.9–10.3)
Chloride: 95 mmol/L — ABNORMAL LOW (ref 101–111)
Creatinine, Ser: 2.04 mg/dL — ABNORMAL HIGH (ref 0.61–1.24)
GFR calc Af Amer: 34 mL/min — ABNORMAL LOW (ref >60)
GFR, EST NON AFRICAN AMERICAN: 30 mL/min — AB (ref >60)
Glucose, Bld: 143 mg/dL — ABNORMAL HIGH (ref 65–99)
POTASSIUM: 5.3 mmol/L — AB (ref 3.5–5.1)
Sodium: 132 mmol/L — ABNORMAL LOW (ref 135–145)
TOTAL PROTEIN: 6.5 g/dL (ref 6.5–8.1)

## 2015-09-25 LAB — BASIC METABOLIC PANEL
BUN: 47 mg/dL — AB (ref 7–25)
CALCIUM: 9 mg/dL (ref 8.6–10.3)
CO2: 19 mmol/L — AB (ref 20–31)
CREATININE: 1.43 mg/dL — AB (ref 0.70–1.18)
Chloride: 92 mmol/L — ABNORMAL LOW (ref 98–110)
GLUCOSE: 152 mg/dL — AB (ref 65–99)
Potassium: 5 mmol/L (ref 3.5–5.3)
Sodium: 131 mmol/L — ABNORMAL LOW (ref 135–146)

## 2015-09-25 LAB — APTT: aPTT: 40 seconds — ABNORMAL HIGH (ref 24–36)

## 2015-09-25 LAB — TSH: TSH: 2.588 u[IU]/mL (ref 0.350–4.500)

## 2015-09-25 LAB — CBG MONITORING, ED: GLUCOSE-CAPILLARY: 128 mg/dL — AB (ref 65–99)

## 2015-09-25 LAB — URINALYSIS, ROUTINE W REFLEX MICROSCOPIC
Glucose, UA: NEGATIVE mg/dL
Hgb urine dipstick: NEGATIVE
Ketones, ur: NEGATIVE mg/dL
LEUKOCYTES UA: NEGATIVE
NITRITE: NEGATIVE
PROTEIN: 30 mg/dL — AB
Specific Gravity, Urine: 1.029 (ref 1.005–1.030)
pH: 5 (ref 5.0–8.0)

## 2015-09-25 LAB — I-STAT CG4 LACTIC ACID, ED
LACTIC ACID, VENOUS: 13.14 mmol/L — AB (ref 0.5–1.9)
Lactic Acid, Venous: 11.19 mmol/L (ref 0.5–1.9)

## 2015-09-25 LAB — PROTIME-INR
INR: 3.18
Prothrombin Time: 33.3 seconds — ABNORMAL HIGH (ref 11.4–15.2)

## 2015-09-25 LAB — LIPASE, BLOOD: LIPASE: 64 U/L — AB (ref 11–51)

## 2015-09-25 LAB — PROCALCITONIN: PROCALCITONIN: 1.05 ng/mL

## 2015-09-25 LAB — TROPONIN I: TROPONIN I: 0.05 ng/mL — AB (ref <0.03)

## 2015-09-25 LAB — BRAIN NATRIURETIC PEPTIDE: B NATRIURETIC PEPTIDE 5: 53.9 pg/mL (ref 0.0–100.0)

## 2015-09-25 LAB — LACTIC ACID, PLASMA
Lactic Acid, Venous: 9.1 mmol/L (ref 0.5–1.9)
Lactic Acid, Venous: 8.9 mmol/L (ref 0.5–1.9)

## 2015-09-25 MED ORDER — PIPERACILLIN-TAZOBACTAM 3.375 G IVPB: 3.3750 g | Freq: Three times a day (TID) | INTRAVENOUS | Status: DC

## 2015-09-25 MED ORDER — SODIUM CHLORIDE 0.9 % IV SOLN
INTRAVENOUS | Status: AC
  Administered 2015-09-25 (×2): via INTRAVENOUS

## 2015-09-25 MED ORDER — SODIUM CHLORIDE 0.9 % IV BOLUS (SEPSIS)
1000.0000 mL | Freq: Once | INTRAVENOUS | Status: AC
  Administered 2015-09-25: 1000 mL via INTRAVENOUS

## 2015-09-25 MED ORDER — SODIUM POLYSTYRENE SULFONATE 15 GM/60ML PO SUSP
15.0000 g | Freq: Once | ORAL | Status: AC
  Administered 2015-09-25: 15 g via ORAL
  Filled 2015-09-25: qty 60

## 2015-09-25 MED ORDER — ONDANSETRON HCL 4 MG/2ML IJ SOLN: 4.0000 mg | Freq: Four times a day (QID) | INTRAMUSCULAR | Status: DC | PRN

## 2015-09-25 MED ORDER — SODIUM CHLORIDE 0.9% FLUSH
3.0000 mL | Freq: Two times a day (BID) | INTRAVENOUS | Status: DC
  Administered 2015-09-26: 3 mL via INTRAVENOUS

## 2015-09-25 MED ORDER — VANCOMYCIN HCL IN DEXTROSE 1-5 GM/200ML-% IV SOLN
1000.0000 mg | Freq: Once | INTRAVENOUS | Status: AC
  Administered 2015-09-25: 1000 mg via INTRAVENOUS
  Filled 2015-09-25: qty 200

## 2015-09-25 MED ORDER — HEPARIN SODIUM (PORCINE) 5000 UNIT/ML IJ SOLN
5000.0000 [IU] | Freq: Three times a day (TID) | INTRAMUSCULAR | Status: DC
  Administered 2015-09-25 – 2015-09-26 (×2): 5000 [IU] via SUBCUTANEOUS
  Filled 2015-09-25 (×2): qty 1

## 2015-09-25 MED ORDER — MAGNESIUM SULFATE IN D5W 1-5 GM/100ML-% IV SOLN
1.0000 g | Freq: Once | INTRAVENOUS | Status: AC
  Administered 2015-09-25: 1 g via INTRAVENOUS
  Filled 2015-09-25: qty 100

## 2015-09-25 MED ORDER — PANTOPRAZOLE SODIUM 40 MG PO TBEC
40.0000 mg | DELAYED_RELEASE_TABLET | Freq: Every day | ORAL | Status: DC
  Administered 2015-09-25 – 2015-09-26 (×2): 40 mg via ORAL
  Filled 2015-09-25 (×2): qty 1

## 2015-09-25 MED ORDER — PIPERACILLIN-TAZOBACTAM 3.375 G IVPB 30 MIN
3.3750 g | Freq: Once | INTRAVENOUS | Status: AC
  Administered 2015-09-25: 3.375 g via INTRAVENOUS
  Filled 2015-09-25: qty 50

## 2015-09-25 MED ORDER — INSULIN ASPART 100 UNIT/ML ~~LOC~~ SOLN
0.0000 [IU] | Freq: Four times a day (QID) | SUBCUTANEOUS | Status: DC
  Administered 2015-09-25: 1 [IU] via SUBCUTANEOUS
  Filled 2015-09-25: qty 1

## 2015-09-25 MED ORDER — SODIUM CHLORIDE 0.9 % IV BOLUS (SEPSIS): 1000.0000 mL | INTRAVENOUS | Status: DC | PRN

## 2015-09-25 MED ORDER — HYDROCODONE-ACETAMINOPHEN 5-325 MG PO TABS: 1.0000 | ORAL_TABLET | Freq: Four times a day (QID) | ORAL | Status: DC | PRN

## 2015-09-25 MED ORDER — PIPERACILLIN-TAZOBACTAM 3.375 G IVPB
3.3750 g | Freq: Three times a day (TID) | INTRAVENOUS | Status: DC
  Administered 2015-09-25 – 2015-09-26 (×4): 3.375 g via INTRAVENOUS
  Filled 2015-09-25 (×7): qty 50

## 2015-09-25 MED ORDER — ALPRAZOLAM 0.25 MG PO TABS: 0.2500 mg | ORAL_TABLET | Freq: Three times a day (TID) | ORAL | Status: DC | PRN

## 2015-09-25 MED ORDER — BISACODYL 10 MG RE SUPP: 10.0000 mg | Freq: Every day | RECTAL | Status: DC | PRN

## 2015-09-25 MED ORDER — SODIUM CHLORIDE 0.9 % IV BOLUS (SEPSIS)
500.0000 mL | Freq: Once | INTRAVENOUS | Status: AC
  Administered 2015-09-25: 500 mL via INTRAVENOUS

## 2015-09-25 MED ORDER — ONDANSETRON HCL 4 MG PO TABS: 4.0000 mg | ORAL_TABLET | Freq: Four times a day (QID) | ORAL | Status: DC | PRN

## 2015-09-25 MED ORDER — METOPROLOL TARTRATE 5 MG/5ML IV SOLN: 5.0000 mg | INTRAVENOUS | Status: DC | PRN

## 2015-09-25 NOTE — ED Notes (Signed)
Dr. Janee MornHompson MD at bedside.

## 2015-09-25 NOTE — ED Notes (Signed)
Please contact wife with any changes in status, or any issues that may arise. Blew are phone numbers to reach her at: Home: [phone] Cell: [phone] Cell: [phone]

## 2015-09-25 NOTE — ED Notes (Signed)
Nurse currently starting IV,  will get labs and first set of blood cultures

## 2015-09-25 NOTE — Telephone Encounter (Signed)
Spoke with pt's wife. Pt is currently being admitted to [facility]. Pt was scheduled to see Dr.Smith on 9/19  To re-establish care. Pt had a PCI back in 2005 done by Dr.Smith. The appt has been cancelled because the pt will be hospitalized. Pt wife wanted us to send an update to Dr.Smith. Adv her that I will fwd the FYI, f/u with cardiology can be determined after the pt is d/c from the hospital. Pt wife voiced appreciation for the call back.

## 2015-09-25 NOTE — Progress Notes (Signed)
Pharmacy Antibiotic Note  Alan Miranda is a 81 y.o. male admitted on 04-11-2015 with possible intra-abdominal infection.  Pharmacy has been consulted for zosyn dosing. Temp low at 97.3, WBC 18.1, lactate peaked at 13.14. Noted SCr 2.04.   Plan: Zosyn 3.375g IV q8h (4 hour infusion).  Monitor renal function, culture results, and clinical picture.   Height: 5\' 10"  (177.8 cm) Weight: 190 lb (86.2 kg) IBW/kg (Calculated) : 73  Temp (24hrs), Avg:96.8 F (36 C), Min:96.2 F (35.7 C), Max:97.3 F (36.3 C)   Recent Labs Lab April 04, 2015 1240 April 04, 2015 1254 April 04, 2015 1535  WBC 18.1*  --   --   CREATININE 2.04*  --   --   LATICACIDVEN  --  13.14* 11.19*    Estimated Creatinine Clearance: 30.8 mL/min (by C-G formula based on SCr of 2.04 mg/dL (H)).    Allergies  Allergen Reactions  . Tetracycline Nausea Only    Antimicrobials this admission: 9/18 vanc x1 in ED 9/18 zosyn >>   Dose adjustments this admission: N/A  Microbiology results: pending   Thank you for allowing pharmacy to be a part of this patient's care.  York CeriseKatherine Cook, PharmD Pharmacy Resident  Pager [phone] April 04, 2015 4:46 PM

## 2015-09-25 NOTE — Consult Note (Signed)
Reason for Consult:choleangitis Referring Physician: Ronnie Derby Cardoiology:  Dr. Daneen Schick PCP: Irven Shelling, MD  Alan Miranda is an 81 y.o. male.  HPI: Pt and wife report being sick since the first week of August.  He was seen and treated for a UTI.  He may have gotten a little better but isn't sure.  His main complaint is nausea. He was seen and sent for an Ultra sound of the abdomen.  This showed multiple gallstones up to 25m. The gallbladder wall was thick and up to 8 mm, no pericholecystic fluid or Murphy's sign.  CBD was 1.4 mm, and there was concern that he had subacute or chronic cholecystitis.  The liver shows multiple foci concerning for metastatic disease or hepatocellular disease.  He also had ascites.   Today he presents to the ED with complaints of weakenss and SOB.  He has not been able to eat or drink much for 2 weeks.  He reports DOE, and swelling to the lower legs.   Work up in the ED shows he is afebrile, VSS, BP up some.  Labs show Na 132, K+ 5.3, creatinine 2.14, BUN 49.  TROPONIN 0.05, BNP 54.  Lactate 13.14. T bilirubin is 4.5, AST 3645, ALT 2910.  WBC is 18.1.  INR is 3.18.  Medicine is admitting and starting work up.  We are ask to see also  Past Medical History:  Diagnosis Date  . CAD (coronary artery disease)    Stent 2005  Dr. [place]aneen Schick . DM2 [place]di[place] type 2[place]   . Dyslipidemia   . Hypertension     Past Surgical History:  Procedure Laterality Date  . APPENDECTOMY    . CARDIAC SURGERY     Stent 2005, Dr. STamala Julian . INGUINAL HERNIA REPAIR Bilateral     No family history on file.  Social History:  reports that he has never smoked. He has never used smokeless tobacco. He reports that he does not drink alcohol. His drug history is not on file. Tobacco -  Never ETOH:  Social, none for 25 years DRUGS:  None Worked for [employer] Allergies:  Allergies  Allergen Reactions  . Tetracycline Nausea Only   Prior to Admission medications    Medication Sig Start Date End Date Taking? Authorizing Provider  acetaminophen (TYLENOL) 500 MG tablet Take 500 mg by mouth every 6 (six) hours as needed for mild pain.   Yes Historical Provider, MD  ALPRAZolam (XANAX) 0.25 MG tablet Take 0.25 mg by mouth every 8 (eight) hours as needed for anxiety. 07/17/15  Yes Historical Provider, MD  atorvastatin (LIPITOR) 40 MG tablet Take 40 mg by mouth daily. 07/17/15  Yes Historical Provider, MD  ciprofloxacin (CIPRO) 500 MG tablet Take 500 mg by mouth 2 (two) times daily. 09/22/15  Yes Historical Provider, MD  glimepiride (AMARYL) 2 MG tablet Take 2 mg by mouth daily. 07/27/15  Yes Historical Provider, MD  metFORMIN (GLUCOPHAGE) 500 MG tablet Take 500 mg by mouth 2 (two) times daily. 09/08/15  Yes Historical Provider, MD  metoprolol tartrate (LOPRESSOR) 25 MG tablet Take 25 mg by mouth 2 (two) times daily.   Yes Historical Provider, MD  omeprazole (PRILOSEC) 20 MG capsule Take 20 mg by mouth daily. 07/27/15  Yes Historical Provider, MD  ramipril (ALTACE) 5 MG capsule Take 5 mg by mouth daily. 06/29/15  Yes Historical Provider, MD      Results for orders placed or performed during the hospital encounter of 09/24/2015 (  from the past 48 hour(s))  Comprehensive metabolic panel     Status: Abnormal   Collection Time: 09/23/2015 12:40 PM  Result Value Ref Range   Sodium 132 (L) 135 - 145 mmol/L   Potassium 5.3 (H) 3.5 - 5.1 mmol/L   Chloride 95 (L) 101 - 111 mmol/L   CO2 15 (L) 22 - 32 mmol/L   Glucose, Bld 143 (H) 65 - 99 mg/dL   BUN 49 (H) 6 - 20 mg/dL   Creatinine, Ser 2.04 (H) 0.61 - 1.24 mg/dL   Calcium 9.1 8.9 - 10.3 mg/dL   Total Protein 6.5 6.5 - 8.1 g/dL   Albumin 3.6 3.5 - 5.0 g/dL   AST 3,645 (H) 15 - 41 U/L    Comment: RESULTS CONFIRMED BY MANUAL DILUTION   ALT 2,910 (H) 17 - 63 U/L    Comment: RESULTS CONFIRMED BY MANUAL DILUTION   Alkaline Phosphatase 298 (H) 38 - 126 U/L   Total Bilirubin 4.5 (H) 0.3 - 1.2 mg/dL   GFR calc non Af Amer 30 (L)  >60 mL/min   GFR calc Af Amer 34 (L) >60 mL/min    Comment: (NOTE) The eGFR has been calculated using the CKD EPI equation. This calculation has not been validated in all clinical situations. eGFR's persistently <60 mL/min signify possible Chronic Kidney Disease.    Anion gap 22 (H) 5 - 15  CBC WITH DIFFERENTIAL     Status: Abnormal   Collection Time: 09/11/2015 12:40 PM  Result Value Ref Range   WBC 18.1 (H) 4.0 - 10.5 K/uL   RBC 5.73 4.22 - 5.81 MIL/uL   Hemoglobin 16.2 13.0 - 17.0 g/dL   HCT 50.4 39.0 - 52.0 %   MCV 88.0 78.0 - 100.0 fL   MCH 28.3 26.0 - 34.0 pg   MCHC 32.1 30.0 - 36.0 g/dL   RDW 15.1 11.5 - 15.5 %   Platelets 160 150 - 400 K/uL   Neutrophils Relative % 80 %   Neutro Abs 14.3 (H) 1.7 - 7.7 K/uL   Lymphocytes Relative 10 %   Lymphs Abs 1.9 0.7 - 4.0 K/uL   Monocytes Relative 10 %   Monocytes Absolute 1.9 (H) 0.1 - 1.0 K/uL   Eosinophils Relative 0 %   Eosinophils Absolute 0.0 0.0 - 0.7 K/uL   Basophils Relative 0 %   Basophils Absolute 0.0 0.0 - 0.1 K/uL  Troponin I     Status: Abnormal   Collection Time: 09/16/2015 12:40 PM  Result Value Ref Range   Troponin I 0.05 (HH) <0.03 ng/mL    Comment: CRITICAL RESULT CALLED TO, READ BACK BY AND VERIFIED WITH: Almyra Brace [id number] Ambia   Brain natriuretic peptide     Status: None   Collection Time: 09/13/2015 12:40 PM  Result Value Ref Range   B Natriuretic Peptide 53.9 0.0 - 100.0 pg/mL  Lipase, blood     Status: Abnormal   Collection Time: 09/11/2015 12:40 PM  Result Value Ref Range   Lipase 64 (H) 11 - 51 U/L  TSH     Status: None   Collection Time: 10/03/2015 12:40 PM  Result Value Ref Range   TSH 2.588 0.350 - 4.500 uIU/mL  I-Stat CG4 Lactic Acid, ED  (not at  [facility])     Status: Abnormal   Collection Time: 09/22/2015 12:54 PM  Result Value Ref Range   Lactic Acid, Venous 13.14 (HH) 0.5 - 1.9 mmol/L   Comment NOTIFIED PHYSICIAN   Protime-INR  Status: Abnormal   Collection Time: 09/22/2015  1:03 PM   Result Value Ref Range   Prothrombin Time 33.3 (H) 11.4 - 15.2 seconds   INR 3.18   I-Stat CG4 Lactic Acid, ED  (not at  [facility])     Status: Abnormal   Collection Time: 09/26/2015  3:35 PM  Result Value Ref Range   Lactic Acid, Venous 11.19 (HH) 0.5 - 1.9 mmol/L   Comment NOTIFIED PHYSICIAN     Dg Chest 2 View  Result Date: 10/05/2015 CLINICAL DATA:  Generalized weakness and shortness of Breath EXAM: CHEST  2 VIEW COMPARISON:  07/07/2009 FINDINGS: Cardiac shadow is within normal limits. The lungs are poorly aerated although no focal infiltrate is seen. Small bilateral pleural effusions are noted. No bony abnormality noted. IMPRESSION: Bilateral pleural effusions Electronically Signed   By: Inez Catalina M.D.   On: 09/17/2015 14:55    Review of Systems  Constitutional: Positive for fever (no temp recorded) and malaise/fatigue. Negative for chills, diaphoresis and weight loss.  HENT: Negative.   Eyes: Negative.   Respiratory: Positive for cough and shortness of breath (DOE). Negative for hemoptysis, sputum production and wheezing.   Cardiovascular: Positive for chest pain and leg swelling. Negative for palpitations, orthopnea, claudication and PND.  Gastrointestinal: Positive for constipation (irregular, now attributes to not eating for 2 weeks) and nausea. Negative for abdominal pain, blood in stool, diarrhea, melena and vomiting.  Genitourinary: Negative.   Musculoskeletal: Positive for back pain.  Skin: Negative.   Neurological: Positive for weakness.  Endo/Heme/Allergies: Negative.   Psychiatric/Behavioral: Negative.    Blood pressure 124/76, pulse 93, temperature 97.3 F (36.3 C), temperature source Rectal, resp. rate 23, height _0  (1.778 m), weight 86.2 kg (190 lb), SpO2 95 %. Physical Exam  Constitutional: He is oriented to person, place, and time. No distress.  Elderly male in no acute distress, but ill appearing  HENT:  Head: Normocephalic and atraumatic.  Nose: Nose  normal.  Eyes: Right eye exhibits no discharge. Left eye exhibits no discharge. No scleral icterus.  Neck: Normal range of motion. Neck supple. No JVD present. No tracheal deviation present. No thyromegaly present.  Cardiovascular: Normal rate, regular rhythm and normal heart sounds.   No murmur heard. Respiratory: Effort normal and breath sounds normal. No respiratory distress. He has no wheezes. He has no rales. He exhibits no tenderness.  GI: Soft. He exhibits distension. He exhibits no mass. There is no tenderness. There is no rebound and no guarding.  Ascites and some scars from prior surgery.  Distended but not really tender.  Musculoskeletal: He exhibits edema (left leg is larger than the right,  +1 edema he also thinks thighs are swollen also.). He exhibits no tenderness.  Lymphadenopathy:    He has no cervical adenopathy.  Neurological: He is alert and oriented to person, place, and time. No cranial nerve deficit.  Skin: Skin is warm and dry. No rash noted. He is not diaphoretic. No erythema. No pallor.  Psychiatric: He has a normal mood and affect. His behavior is normal. Judgment and thought content normal.    Assessment/Plan: Cholelithiasis, possible cholangitis, cholecystitis Liver disease vs Metastatic disease - acute liver failure Acute renal failure AODM Hypertension CAD with prior stent 2005 Dyslipidemia  Plan:  Medicine is starting a full evaluation.  At this point he will most likely get a GI evaluation with probable ERCP. CT scans are also pending.  IV Zosyn, and once he is stable he will most likely  require a cholecystostomy drain.  We will follow with you.     Bayler Nehring 09/28/2015, 4:58 PM

## 2015-09-25 NOTE — ED Provider Notes (Signed)
[facility]-EMERGENCY DEPT Provider Note   CSN: [id number] Arrival date & time: 09/29/2015  1133     History   Chief Complaint Chief Complaint  Patient presents with  . Weakness  . Shortness of Breath    HPI Alan Miranda is a 81 y.o. male.  Most of the patient's history is obtained from family as the patient is slightly confused. This isn't the patient's had a couple years and progressively declining health and specifically over last couple weeks has had significantly deep creased strength and increased confusion. Thousand UTI initially but was on antibiotics and then get better. He had been bouncing around between his primary doctor and his cardiologist trying to figure out the problem. He had ultrasound of his liver done on Friday which showed evidence of chronic cholecystitis and some liver lesions concerning for metastatic disease. He is seen by his cardiologist today and had a echocardiogram done which showed a ejection fraction of 35-40%. He lives at home with his wife who is been having difficulty taking care of him and get him around the house so they brought him here for further evaluation and help with these issues. Patient does not have any abdominal pain but does complain of abdominal distention and lower extremity edema. He also states he is very weak when he tries to walk and cannot do so. Has had some shortness of breath recently as well. Has known pleural effusions. He has had no fevers, chills or sweats. He does have a recent severe weight loss per the family.      Past Medical History:  Diagnosis Date  . CAD (coronary artery disease)    Stent 2005  Dr. Verdis Prime  . DM2 (diabetes mellitus, type 2) (HCC)   . Dyslipidemia   . Hypertension     Patient Active Problem List   Diagnosis Date Noted  . Sepsis (HCC) 09/30/2015  . ARF (acute renal failure) (HCC) 09/23/2015  . Diabetes mellitus without complication (HCC)   . Essential hypertension   . Coronary artery disease due  to lipid rich plaque   . Dyslipidemia   . DM2 (diabetes mellitus, type 2) (HCC)   . CAD (coronary artery disease)     Past Surgical History:  Procedure Laterality Date  . APPENDECTOMY    . CARDIAC SURGERY     Stent 2005, Dr. Katrinka Blazing  . INGUINAL HERNIA REPAIR Bilateral        Home Medications    Prior to Admission medications   Medication Sig Start Date End Date Taking? Authorizing Provider  acetaminophen (TYLENOL) 500 MG tablet Take 500 mg by mouth every 6 (six) hours as needed for mild pain.   Yes Historical Provider, MD  ALPRAZolam (XANAX) 0.25 MG tablet Take 0.25 mg by mouth every 8 (eight) hours as needed for anxiety. 07/17/15  Yes Historical Provider, MD  atorvastatin (LIPITOR) 40 MG tablet Take 40 mg by mouth daily. 07/17/15  Yes Historical Provider, MD  ciprofloxacin (CIPRO) 500 MG tablet Take 500 mg by mouth 2 (two) times daily. 09/22/15  Yes Historical Provider, MD  glimepiride (AMARYL) 2 MG tablet Take 2 mg by mouth daily. 07/27/15  Yes Historical Provider, MD  metFORMIN (GLUCOPHAGE) 500 MG tablet Take 500 mg by mouth 2 (two) times daily. 09/08/15  Yes Historical Provider, MD  metoprolol tartrate (LOPRESSOR) 25 MG tablet Take 25 mg by mouth 2 (two) times daily.   Yes Historical Provider, MD  omeprazole (PRILOSEC) 20 MG capsule Take 20 mg by mouth daily.  07/27/15  Yes Historical Provider, MD  ramipril (ALTACE) 5 MG capsule Take 5 mg by mouth daily. 06/29/15  Yes Historical Provider, MD    Family History No family history on file.  Social History Social History  Substance Use Topics  . Smoking status: Never Smoker  . Smokeless tobacco: Never Used  . Alcohol use No     Allergies   Tetracycline   Review of Systems Review of Systems  All other systems reviewed and are negative.    Physical Exam Updated Vital Signs BP 113/85   Pulse 91   Temp 97.3 F (36.3 C) (Rectal)   Resp 23   Ht 5\' 10"  (1.778 m)   Wt 190 lb (86.2 kg)   SpO2 94%   BMI 27.26 kg/m    Physical Exam  Constitutional: He appears well-developed and well-nourished.  HENT:  Head: Normocephalic and atraumatic.  Eyes: Conjunctivae are normal.  Neck: Neck supple.  Cardiovascular: Normal rate and regular rhythm.   No murmur heard. Pulmonary/Chest: Effort normal. No respiratory distress. He has rales.  Abdominal: Soft. He exhibits distension. There is no tenderness.  Musculoskeletal: He exhibits edema.  Neurological: He is alert.  Skin: Skin is warm and dry.  Psychiatric: He has a normal mood and affect.  Nursing note and vitals reviewed.    ED Treatments / Results  Labs (all labs ordered are listed, but only abnormal results are displayed) Labs Reviewed  COMPREHENSIVE METABOLIC PANEL - Abnormal; Notable for the following:       Result Value   Sodium 132 (*)    Potassium 5.3 (*)    Chloride 95 (*)    CO2 15 (*)    Glucose, Bld 143 (*)    BUN 49 (*)    Creatinine, Ser 2.04 (*)    AST 3,645 (*)    ALT 2,910 (*)    Alkaline Phosphatase 298 (*)    Total Bilirubin 4.5 (*)    GFR calc non Af Amer 30 (*)    GFR calc Af Amer 34 (*)    Anion gap 22 (*)    All other components within normal limits  CBC WITH DIFFERENTIAL/PLATELET - Abnormal; Notable for the following:    WBC 18.1 (*)    Neutro Abs 14.3 (*)    Monocytes Absolute 1.9 (*)    All other components within normal limits  TROPONIN I - Abnormal; Notable for the following:    Troponin I 0.05 (*)    All other components within normal limits  LIPASE, BLOOD - Abnormal; Notable for the following:    Lipase 64 (*)    All other components within normal limits  PROTIME-INR - Abnormal; Notable for the following:    Prothrombin Time 33.3 (*)    All other components within normal limits  APTT - Abnormal; Notable for the following:    aPTT 40 (*)    All other components within normal limits  I-STAT CG4 LACTIC ACID, ED - Abnormal; Notable for the following:    Lactic Acid, Venous 13.14 (*)    All other  components within normal limits  I-STAT CG4 LACTIC ACID, ED - Abnormal; Notable for the following:    Lactic Acid, Venous 11.19 (*)    All other components within normal limits  CULTURE, BLOOD (ROUTINE X 2)  CULTURE, BLOOD (ROUTINE X 2)  URINE CULTURE  BRAIN NATRIURETIC PEPTIDE  TSH  URINALYSIS, ROUTINE W REFLEX MICROSCOPIC (NOT AT [facility])  LACTIC ACID, PLASMA  LACTIC ACID, PLASMA  PROCALCITONIN  HEPATITIS PANEL, ACUTE    EKG  EKG Interpretation None       Radiology Dg Chest 2 View  Result Date: 09/15/2015 CLINICAL DATA:  Generalized weakness and shortness of Breath EXAM: CHEST  2 VIEW COMPARISON:  07/07/2009 FINDINGS: Cardiac shadow is within normal limits. The lungs are poorly aerated although no focal infiltrate is seen. Small bilateral pleural effusions are noted. No bony abnormality noted. IMPRESSION: Bilateral pleural effusions Electronically Signed   By: Alcide Clever M.D.   On: 09/24/2015 14:55    Procedures Procedures (including critical care time)  CRITICAL CARE Performed by: Marily Memos Total critical care time: 65 minutes Critical care time was exclusive of separately billable procedures and treating other patients. Critical care was necessary to treat or prevent imminent or life-threatening deterioration. Critical care was time spent personally by me on the following activities: development of treatment plan with patient and/or surrogate as well as nursing, discussions with consultants, evaluation of patient's response to treatment, examination of patient, obtaining history from patient or surrogate, ordering and performing treatments and interventions, ordering and review of laboratory studies, ordering and review of radiographic studies, pulse oximetry and re-evaluation of patient's condition.   Medications Ordered in ED Medications  0.9 %  sodium chloride infusion ( Intravenous New Bag/Given 09/10/2015 1623)  sodium chloride 0.9 % bolus 1,000 mL (not  administered)  sodium polystyrene (KAYEXALATE) 15 GM/60ML suspension 15 g (not administered)  magnesium sulfate IVPB 1 g 100 mL (1 g Intravenous New Bag/Given 10/07/2015 1703)  metoprolol (LOPRESSOR) injection 5 mg (not administered)  piperacillin-tazobactam (ZOSYN) IVPB 3.375 g (not administered)  sodium chloride 0.9 % bolus 1,000 mL (0 mLs Intravenous Stopped 09/16/2015 1346)  vancomycin (VANCOCIN) IVPB 1000 mg/200 mL premix (0 mg Intravenous Stopped 09/29/2015 1458)  piperacillin-tazobactam (ZOSYN) IVPB 3.375 g (0 g Intravenous Stopped 09/28/2015 1423)  sodium chloride 0.9 % bolus 1,000 mL (0 mLs Intravenous Stopped 09/10/2015 1456)  sodium chloride 0.9 % bolus 500 mL (0 mLs Intravenous Stopped 09/15/2015 1612)     Initial Impression / Assessment and Plan / ED Course  I have reviewed the triage vital signs and the nursing notes.  Pertinent labs & imaging results that were available during my care of the patient were reviewed by me and considered in my medical decision making (see chart for details).  81 yo M here with weakness, hypothermia and sob. Exam with fluid overload. Review of records review of evidence of likely metastatic disease to liver.   Lactic acid of 13, likely 2/2 liver metastasis. Will add on INR.  Elevated liver enzymes as well, concern for hepatitis.   Clinical Course    After white count came back, meets sirs criteria. I think this is 2/2 his likely new malignancy, however I don't have a urinalysis or CXR yet so will cover with broad antibiotics in case it is infectious in nature. Will also make sure he has had 30 cc/kg bolus and recheck lactic acid.  Not able to get CT scans 2/2 AKI. Hopefully this will improve while inpatient and can get further work up.   Discussed with hospitailst, Dr. Thedore Mins, who requests PCCM discussion about possible cholangitis and sepsis.   Discussed with Dr. Christene Slates about the patient who recommends non contrasted CT scans and if VS stable ok for  stepdown.   Discussed with Dr Thedore Mins again who agrees to admission to stepdown but requests GI consult as well.   Discussed with Dr Evette Cristal with GI who will  see patient.   Final Clinical Impressions(s) / ED Diagnoses   Final diagnoses:  Sepsis (HCC)  Diabetes mellitus without complication (HCC)  Essential hypertension  Coronary artery disease due to lipid rich plaque  Dyslipidemia  Type 2 diabetes mellitus with hyperosmolarity without coma, without long-term current use of insulin (HCC)  Coronary artery disease involving native coronary artery of native heart without angina pectoris    New Prescriptions New Prescriptions   No medications on file     Marily MemosJason Jaqulyn Chancellor, MD 09/18/2015 1757

## 2015-09-25 NOTE — ED Notes (Signed)
Dr. Clayborne DanaMesner made aware Lactic Acid 13.14

## 2015-09-25 NOTE — ED Triage Notes (Signed)
Patient presents today with complaints generalized weakness and SOB. Patient denies any pain. Patient denies any SOB complain on SOB with Movement. Mild Swelling noted to the Lower extermites . Patient temp cold. Patient Alert and oriented x4

## 2015-09-25 NOTE — Care Management Note (Signed)
Case Management Note  Patient Details  Name: Alan Miranda MRN: [id number] Date of Birth: 1934/08/30  Subjective/Objective:                  81 yo male Patient presents today with complaints generalized weakness and SOB. From home with wife.  Action/Plan: Follow for disposition needs.   Expected Discharge Date:  09/28/15               Expected Discharge Plan:     In-House Referral:     Discharge planning Services     Post Acute Care Choice:    Choice offered to:     DME Arranged:    DME Agency:     HH Arranged:    HH Agency:     Status of Service:     If discussed at [employer] Meetings, dates discussed:    Additional Comments: Pt was scheduled to see Dr.Smith [facility] [address] on 9/19  To re-establish care. Pt had a PCI back in 2005 done by Dr.Smith. The appt has been cancelled because the pt will be hospitalized. [name], [name], RN 2015/03/09, 3:01 PM

## 2015-09-25 NOTE — ED Notes (Signed)
Gave pt a urinal, pt says he is not able to use the bathroom at this time.

## 2015-09-25 NOTE — ED Notes (Signed)
Pt vomited kayexalate shortly after ingesting.

## 2015-09-25 NOTE — Telephone Encounter (Signed)
New Message  Pt wife call to inform provider pt will has been hospitalized. Pt appt has been cancels and pt wife would like to speak with RN about pt. Please call back to discuss

## 2015-09-25 NOTE — Consult Note (Signed)
Subjective:   HPI  The patient is a 81 year old male who came to the emergency room today because of extreme fatigue. He was so tired and fatigued that he could not walk more than a few status. He had a recent abdominal ultrasound which showed gallstones. There were also findings in the liver of multiple foci of abnormalities which could represent metastatic disease. In the emergency room he was found to have a white blood cell count of 18,100. Lactic acid was very high. Prothrombin time 33.3. INR 3.18. Patient has not been on Coumadin. Patient denies any known liver disease. He denies drinking alcohol. AST 3645, ALT 2910, total bilirubin 4.5. Patient denies right upper quadrant abdominal pain or any abdominal pain.     Past Medical History:  Diagnosis Date  . CAD (coronary artery disease)    Stent 2005  Dr. Verdis Prime  . DM2 (diabetes mellitus, type 2) (HCC)   . Dyslipidemia   . Hypertension    Past Surgical History:  Procedure Laterality Date  . APPENDECTOMY    . CARDIAC SURGERY     Stent 2005, Dr. Katrinka Blazing  . INGUINAL HERNIA REPAIR Bilateral    Social History   Social History  . Marital status: Married    Spouse name: N/A  . Number of children: N/A  . Years of education: N/A   Occupational History  . Not on file.   Social History Main Topics  . Smoking status: Never Smoker  . Smokeless tobacco: Never Used  . Alcohol use No  . Drug use: Unknown  . Sexual activity: Not on file   Other Topics Concern  . Not on file   Social History Narrative  . No narrative on file   family history is not on file.  Current Facility-Administered Medications:  .  0.9 %  sodium chloride infusion, , Intravenous, Continuous, Leroy Sea, MD, Last Rate: 75 mL/hr at 10/18/2015 1623 .  magnesium sulfate IVPB 1 g 100 mL, 1 g, Intravenous, Once, Leroy Sea, MD, 1 g at 10-18-2015 1703 .  metoprolol (LOPRESSOR) injection 5 mg, 5 mg, Intravenous, Q4H PRN, Leroy Sea, MD .   piperacillin-tazobactam (ZOSYN) IVPB 3.375 g, 3.375 g, Intravenous, Q8H, Cherre Huger, RPH .  sodium chloride 0.9 % bolus 1,000 mL, 1,000 mL, Intravenous, PRN, Leroy Sea, MD .  sodium polystyrene (KAYEXALATE) 15 GM/60ML suspension 15 g, 15 g, Oral, Once, Leroy Sea, MD  Current Outpatient Prescriptions:  .  acetaminophen (TYLENOL) 500 MG tablet, Take 500 mg by mouth every 6 (six) hours as needed for mild pain., Disp: , Rfl:  .  ALPRAZolam (XANAX) 0.25 MG tablet, Take 0.25 mg by mouth every 8 (eight) hours as needed for anxiety., Disp: , Rfl:  .  atorvastatin (LIPITOR) 40 MG tablet, Take 40 mg by mouth daily., Disp: , Rfl:  .  ciprofloxacin (CIPRO) 500 MG tablet, Take 500 mg by mouth 2 (two) times daily., Disp: , Rfl:  .  glimepiride (AMARYL) 2 MG tablet, Take 2 mg by mouth daily., Disp: , Rfl:  .  metFORMIN (GLUCOPHAGE) 500 MG tablet, Take 500 mg by mouth 2 (two) times daily., Disp: , Rfl: 1 .  metoprolol tartrate (LOPRESSOR) 25 MG tablet, Take 25 mg by mouth 2 (two) times daily., Disp: , Rfl:  .  omeprazole (PRILOSEC) 20 MG capsule, Take 20 mg by mouth daily., Disp: , Rfl:  .  ramipril (ALTACE) 5 MG capsule, Take 5 mg by mouth daily., Disp: ,  Rfl:  Allergies  Allergen Reactions  . Tetracycline Nausea Only     Objective:     BP 113/85   Pulse 91   Temp 97.3 F (36.3 C) (Rectal)   Resp 23   Ht 5\' 10"  (1.778 m)   Wt 86.2 kg (190 lb)   SpO2 94%   BMI 27.26 kg/m   He does not appear in any acute distress  Slight scleral icterus  Heart regular rhythm no murmurs  Lungs clear  Abdomen: Bowel sounds present, soft, nontender, no obvious hepatosplenomegaly  Laboratory No components found for: D1    Assessment:     #1. Sepsis this could be of biliary origin  #2. Gallstones  #3. Markedly elevated liver enzymes      Plan:     The patient is being admitted to the hospital. A CT scan of the abdomen as well as an MRI of the abdomen have been ordered. We need  to determine if he might have choledocholithiasis. He has been started on Zosyn. I would obtain a hepatitis profile. Surgical consultation is also planned. We will follow.

## 2015-09-25 NOTE — H&P (Addendum)
TRH H&P   Patient Demographics:    Algis DownsCoy Sobh, is a 81 y.o. male  MRN: [id number]   DOB - [birth year]  Admit Date - 09/12/2015  Outpatient Primary MD for the patient is Lillia MountainGRIFFIN,JOHN JOSEPH, MD  Outpatient Specialists: Dr Katrinka BlazingSmith Cards    Patient coming from: Home  Chief Complaint  Patient presents with  . Weakness  . Shortness of Breath      HPI:    Algis DownsCoy Montroy  is a 81 y.o. male, with H/O CAD status post stent placement by Dr. Verdis PrimeHenry Smith over 5 years ago, Hypertension, dyslipidemia, type 2 diabetes mellitus, anxiety, Who has been having some generalized weakness, Subjective low-grade fevers for the last 5-6 weeks, he also went to see his PCP a few days ago and Had an outpatient abdominal ultrasound showing gallstones with changes suspicious for subacute versus chronic cholecystitis. He was placed on Cipro, however he did not feel any better and continued to get worse to the point that now he cannot walk more than 5-6 steps without getting extremely tired and fatigued.  Came to the ER where his workup was suggestive of severe sepsis with acute renal failure, lactic acid of 13.5, massive elevation of liver enzymes picture suspicious for gram-negative sepsis coming from a intra-abdominal source. ER physician discussed the case with critical care who requested hospitalist to admit.  Patient surprisingly besides generalized weakness, low-grade fevers and denies any headache, no chest pain or cough, does have some generalized abdominal pain but no focal pain or tenderness, no dysuria, no new joint pains or aches, no focal weakness.      Review of systems:    In addition to the HPI above,   No Fever-chills, No Headache,  No changes with Vision or hearing, No problems swallowing food or Liquids, No Chest pain, Cough or Shortness of Breath, No Abdominal pain, No Nausea or Vommitting, Bowel movements are regular, No Blood in stool or Urine, No dysuria, No new skin rashes or bruises, No new joints pains-aches,  No new weakness, tingling, numbness in any extremity, Positive generalized weakness, No recent weight gain or loss, No polyuria, polydypsia or polyphagia, No significant Mental Stressors.  A full 10 point Review of Systems was done, except as stated above, all other Review of Systems were negative.   With Past History of the  following :    Past Medical History:  Diagnosis Date  . CAD (coronary artery disease)   . DM2 (diabetes mellitus, type 2) (HCC)   . Dyslipidemia   . Hypertension       Past Surgical History:  Procedure Laterality Date  . CARDIAC SURGERY        Social History:     Social History  Substance Use Topics  . Smoking status: Never Smoker  . Smokeless tobacco: Never Used  . Alcohol use No         Family History :   No history of cholecystitis or gallbladder cancer   Home Medications:   Prior to Admission medications   Medication Sig Start Date End Date Taking? Authorizing Provider  acetaminophen (TYLENOL) 500 MG tablet Take 500 mg by mouth every 6 (six) hours as needed for mild pain.   Yes Historical Provider, MD  ALPRAZolam (XANAX) 0.25 MG tablet Take 0.25 mg by mouth every 8 (eight) hours as needed for anxiety. 07/17/15  Yes Historical Provider, MD  atorvastatin (LIPITOR) 40 MG tablet Take 40 mg by mouth daily. 07/17/15  Yes Historical Provider, MD  ciprofloxacin (CIPRO) 500 MG tablet Take 500 mg by mouth 2 (two) times daily. 09/22/15  Yes Historical Provider, MD  glimepiride (AMARYL) 2 MG tablet Take 2 mg by mouth daily. 07/27/15  Yes Historical Provider, MD  metFORMIN (GLUCOPHAGE) 500 MG tablet Take 500 mg by mouth 2 (two) times daily. 09/08/15  Yes Historical  Provider, MD  metoprolol tartrate (LOPRESSOR) 25 MG tablet Take 25 mg by mouth 2 (two) times daily.   Yes Historical Provider, MD  omeprazole (PRILOSEC) 20 MG capsule Take 20 mg by mouth daily. 07/27/15  Yes Historical Provider, MD  ramipril (ALTACE) 5 MG capsule Take 5 mg by mouth daily. 06/29/15  Yes Historical Provider, MD     Allergies:     Allergies  Allergen Reactions  . Tetracycline Nausea Only     Physical Exam:   Vitals  Blood pressure 139/84, pulse 80, temperature 97.3 F (36.3 C), temperature source Rectal, resp. rate 23, height 5\' 10"  (1.778 m), weight 86.2 kg (190 lb), SpO2 98 %.   1. General Pleasant elderly white male lying in bed in NAD,   2. Normal affect and insight, Not Suicidal or Homicidal, Awake Alert, Oriented X 3 But still minimally confused.  3. No F.N deficits, ALL C.Nerves Intact, Strength 5/5 all 4 extremities, Sensation intact all 4 extremities, Plantars down going.  4. Ears and Eyes appear Normal, Conjunctivae clear, PERRLA. Moist Oral Mucosa.  5. Supple Neck, No JVD, No cervical lymphadenopathy appriciated, No Carotid Bruits.  6. Symmetrical Chest wall movement, Good air movement bilaterally, CTAB.  7. RRR, No Gallops, Rubs or Murmurs, No Parasternal Heave.  8. Positive Bowel Sounds, Abdomen Soft, No tenderness, No organomegaly appriciated,No rebound -guarding or rigidity.  9.  No Cyanosis, Normal Skin Turgor, No Skin Rash or Bruise.  10. Good muscle tone,  joints appear normal , no effusions, Normal ROM.  11. No Palpable Lymph Nodes in Neck or Axillae      Data Review:    CBC  Recent Labs Lab 09/26/2015 1240  WBC 18.1*  HGB 16.2  HCT 50.4  PLT 160  MCV 88.0  MCH 28.3  MCHC 32.1  RDW 15.1  LYMPHSABS 1.9  MONOABS 1.9*  EOSABS 0.0  BASOSABS 0.0   ------------------------------------------------------------------------------------------------------------------  Chemistries   Recent Labs Lab 09/10/2015 1240  NA 132*  K  5.3*  CL 95*  CO2 15*  GLUCOSE 143*  BUN 49*  CREATININE 2.04*  CALCIUM 9.1  AST 3,645*  ALT 2,910*  ALKPHOS 298*  BILITOT 4.5*   ------------------------------------------------------------------------------------------------------------------ estimated creatinine clearance is 30.8 mL/min (by C-G formula based on SCr of 2.04 mg/dL (H)). ------------------------------------------------------------------------------------------------------------------  Recent Labs  09/30/2015 1240  TSH 2.588    Coagulation profile  Recent Labs Lab September 30, 2015 1303  INR 3.18   ------------------------------------------------------------------------------------------------------------------- No results for input(s): DDIMER in the last 72 hours. -------------------------------------------------------------------------------------------------------------------  Cardiac Enzymes  Recent Labs Lab Sep 30, 2015 1240  TROPONINI 0.05*   ------------------------------------------------------------------------------------------------------------------    Component Value Date/Time   BNP 53.9 Sep 30, 2015 1240     ---------------------------------------------------------------------------------------------------------------  Urinalysis    Component Value Date/Time   COLORURINE YELLOW 07/07/2009 1230   APPEARANCEUR CLEAR 07/07/2009 1230   LABSPEC 1.021 07/07/2009 1230   PHURINE 6.5 07/07/2009 1230   GLUCOSEU 100 (A) 07/07/2009 1230   HGBUR NEGATIVE 07/07/2009 1230   BILIRUBINUR NEGATIVE 07/07/2009 1230   KETONESUR NEGATIVE 07/07/2009 1230   PROTEINUR NEGATIVE 07/07/2009 1230   UROBILINOGEN 1.0 07/07/2009 1230   NITRITE NEGATIVE 07/07/2009 1230   LEUKOCYTESUR  07/07/2009 1230    NEGATIVE MICROSCOPIC NOT DONE ON URINES WITH NEGATIVE PROTEIN, BLOOD, LEUKOCYTES, NITRITE, OR GLUCOSE <1000 mg/dL.     ----------------------------------------------------------------------------------------------------------------   Imaging Results:    Dg Chest 2 View  Result Date: 09/30/15 CLINICAL DATA:  Generalized weakness and shortness of Breath EXAM: CHEST  2 VIEW COMPARISON:  07/07/2009 FINDINGS: Cardiac shadow is within normal limits. The lungs are poorly aerated although no focal infiltrate is seen. Small bilateral pleural effusions are noted. No bony abnormality noted. IMPRESSION: Bilateral pleural effusions Electronically Signed   By: Alcide Clever M.D.   On: 09/30/2015 14:55    My personal review of EKG: Rhythm NSR, PVCs, non specific ST changes   Assessment & Plan:     1. Severe sepsis with acute renal failure. Most likely intra-abdominal source with gram-negative infection, liver enzymes extremely elevated suspicious for either a CBD stone or subacute cholecystitis. Note recent outpatientultrasound as above, patient will be admitted to stepdown, sepsis protocol has been initiated, for now continue Zosyn IV, he has received 1 dose of IV vancomycin which should suffice for another 24-48 hours. He has been pancultured.Critical care was consulted over the phone by ED physician and they have nothing to offer at this time.Patient has received  IV fluids per protocol as well. We'll trend lactate and pro-calcitonin.  GI and general surgery both have been consulted, CT scan abdomen and pelvis and chest noncontrast has been ordered as well. May need MRCP/ERCP as well.  2. ARF, elevated INR due to #1 above. Hydrate, sepsis treatment as above, avoid nephrotoxins and hold ACE inhibitor.  3. 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 He is currently compensated from CHF standpoint, BNP is normal, mild elevation in troponin without any chest pain or acute EKG changes due to demand ischemia from sepsis. For now supportive care with aspirin  only.  4.Dyslipidemia.Hold statin until liver enzymes have stabilized.  5.Essential hypertension.Due to sepsis only as needed IV Lopressor.  6.DM type II. Sliding scale insulin for now.  7. ? Liver changes on recent US - CT & GI input.    DVT Prophylaxis Heparin    AM Labs Ordered, also please review Full Orders  Family Communication: Admission, patients condition and plan of care including tests being ordered have been discussed  with the patient   who indicates understanding and agree with the plan and Code Status.  Code Status Full  Likely DC to  Home 3-4 days  Condition GUARDED    Consults called: CCS,GI    Admission status: Stepdown  Time spent in minutes : 45   Alton Bouknight K M.D on October 24, 2015 at 4:21 PM  Between 7am to 7pm - Pager - [phone]. After 7pm go to [email or web address] - password [facility]  [facility] - Office  [phone]

## 2015-09-25 NOTE — ED Notes (Signed)
Patient returned from CT

## 2015-09-25 NOTE — ED Notes (Signed)
Critical Lab Value: Lactic 9.1 Dr. Thedore MinsSingh made aware.

## 2015-09-26 ENCOUNTER — Other Ambulatory Visit: Payer: 59

## 2015-09-26 ENCOUNTER — Inpatient Hospital Stay (HOSPITAL_COMMUNITY): Payer: Medicare Other

## 2015-09-26 ENCOUNTER — Ambulatory Visit: Payer: 59 | Admitting: Interventional Cardiology

## 2015-09-26 DIAGNOSIS — I2583 Coronary atherosclerosis due to lipid rich plaque: Secondary | ICD-10-CM

## 2015-09-26 DIAGNOSIS — G9340 Encephalopathy, unspecified: Secondary | ICD-10-CM

## 2015-09-26 DIAGNOSIS — N179 Acute kidney failure, unspecified: Secondary | ICD-10-CM

## 2015-09-26 DIAGNOSIS — D696 Thrombocytopenia, unspecified: Secondary | ICD-10-CM | POA: Diagnosis not present

## 2015-09-26 DIAGNOSIS — E785 Hyperlipidemia, unspecified: Secondary | ICD-10-CM

## 2015-09-26 DIAGNOSIS — I2510 Atherosclerotic heart disease of native coronary artery without angina pectoris: Secondary | ICD-10-CM

## 2015-09-26 DIAGNOSIS — J9601 Acute respiratory failure with hypoxia: Secondary | ICD-10-CM

## 2015-09-26 DIAGNOSIS — I10 Essential (primary) hypertension: Secondary | ICD-10-CM

## 2015-09-26 DIAGNOSIS — K7200 Acute and subacute hepatic failure without coma: Secondary | ICD-10-CM

## 2015-09-26 DIAGNOSIS — N171 Acute kidney failure with acute cortical necrosis: Secondary | ICD-10-CM

## 2015-09-26 DIAGNOSIS — R160 Hepatomegaly, not elsewhere classified: Secondary | ICD-10-CM

## 2015-09-26 DIAGNOSIS — A419 Sepsis, unspecified organism: Principal | ICD-10-CM

## 2015-09-26 DIAGNOSIS — E119 Type 2 diabetes mellitus without complications: Secondary | ICD-10-CM

## 2015-09-26 LAB — BASIC METABOLIC PANEL
ANION GAP: 20 — AB (ref 5–15)
ANION GAP: 27 — AB (ref 5–15)
BUN: 55 mg/dL — AB (ref 6–20)
BUN: 61 mg/dL — ABNORMAL HIGH (ref 6–20)
CALCIUM: 7.9 mg/dL — AB (ref 8.9–10.3)
CHLORIDE: 102 mmol/L (ref 101–111)
CO2: 11 mmol/L — AB (ref 22–32)
CO2: 9 mmol/L — AB (ref 22–32)
CREATININE: 1.97 mg/dL — AB (ref 0.61–1.24)
Calcium: 7.7 mg/dL — ABNORMAL LOW (ref 8.9–10.3)
Chloride: 100 mmol/L — ABNORMAL LOW (ref 101–111)
Creatinine, Ser: 2.48 mg/dL — ABNORMAL HIGH (ref 0.61–1.24)
GFR calc Af Amer: 36 mL/min — ABNORMAL LOW (ref >60)
GFR calc Af Amer: 27 mL/min — ABNORMAL LOW (ref >60)
GFR calc non Af Amer: 23 mL/min — ABNORMAL LOW (ref >60)
GFR, EST NON AFRICAN AMERICAN: 31 mL/min — AB (ref >60)
GLUCOSE: 111 mg/dL — AB (ref 65–99)
GLUCOSE: 112 mg/dL — AB (ref 65–99)
POTASSIUM: 6.3 mmol/L — AB (ref 3.5–5.1)
Potassium: 5.1 mmol/L (ref 3.5–5.1)
Sodium: 131 mmol/L — ABNORMAL LOW (ref 135–145)
Sodium: 138 mmol/L (ref 135–145)

## 2015-09-26 LAB — BLOOD GAS, ARTERIAL
ACID-BASE DEFICIT: 20.4 mmol/L — AB (ref 0.0–2.0)
BICARBONATE: 6.8 mmol/L — AB (ref 20.0–28.0)
Drawn by: 129711
FIO2: 40
LHR: 30 {breaths}/min
O2 SAT: 93.9 %
PATIENT TEMPERATURE: 98.6
PCO2 ART: 20.3 mmHg — AB (ref 32.0–48.0)
PEEP/CPAP: 5 cmH2O
PH ART: 7.153 — AB (ref 7.350–7.450)
VT: 600 mL
pO2, Arterial: 103 mmHg (ref 83.0–108.0)

## 2015-09-26 LAB — POCT I-STAT 3, ART BLOOD GAS (G3+)
Acid-base deficit: 18 mmol/L — ABNORMAL HIGH (ref 0.0–2.0)
BICARBONATE: 7.9 mmol/L — AB (ref 20.0–28.0)
O2 Saturation: 95 %
PCO2 ART: 19.5 mmHg — AB (ref 32.0–48.0)
Patient temperature: 95.8
TCO2: 9 mmol/L (ref 0–100)
pH, Arterial: 7.209 — ABNORMAL LOW (ref 7.350–7.450)
pO2, Arterial: 81 mmHg — ABNORMAL LOW (ref 83.0–108.0)

## 2015-09-26 LAB — HEPATIC FUNCTION PANEL
ALBUMIN: 2.8 g/dL — AB (ref 3.5–5.0)
ALK PHOS: 307 U/L — AB (ref 38–126)
ALT: 3040 U/L — ABNORMAL HIGH (ref 17–63)
ALT: 3040 U/L — AB (ref 17–63)
AST: 4419 U/L — ABNORMAL HIGH (ref 15–41)
AST: 5200 U/L — AB (ref 15–41)
Albumin: 3.1 g/dL — ABNORMAL LOW (ref 3.5–5.0)
Alkaline Phosphatase: 325 U/L — ABNORMAL HIGH (ref 38–126)
BILIRUBIN TOTAL: 5.1 mg/dL — AB (ref 0.3–1.2)
Bilirubin, Direct: 2.4 mg/dL — ABNORMAL HIGH (ref 0.1–0.5)
Bilirubin, Direct: 3.1 mg/dL — ABNORMAL HIGH (ref 0.1–0.5)
Indirect Bilirubin: 2.7 mg/dL — ABNORMAL HIGH (ref 0.3–0.9)
Indirect Bilirubin: 2.7 mg/dL — ABNORMAL HIGH (ref 0.3–0.9)
TOTAL PROTEIN: 4.9 g/dL — AB (ref 6.5–8.1)
Total Bilirubin: 5.8 mg/dL — ABNORMAL HIGH (ref 0.3–1.2)
Total Protein: 5.5 g/dL — ABNORMAL LOW (ref 6.5–8.1)

## 2015-09-26 LAB — BRAIN NATRIURETIC PEPTIDE: B NATRIURETIC PEPTIDE 5: 112.6 pg/mL — AB (ref 0.0–100.0)

## 2015-09-26 LAB — GLUCOSE, CAPILLARY
GLUCOSE-CAPILLARY: 89 mg/dL (ref 65–99)
GLUCOSE-CAPILLARY: 45 mg/dL — AB (ref 65–99)
GLUCOSE-CAPILLARY: 117 mg/dL — AB (ref 65–99)
Glucose-Capillary: 99 mg/dL (ref 65–99)
Glucose-Capillary: 81 mg/dL (ref 65–99)
Glucose-Capillary: 127 mg/dL — ABNORMAL HIGH (ref 65–99)

## 2015-09-26 LAB — CBC
HCT: 44.5 % (ref 39.0–52.0)
Hemoglobin: 14.2 g/dL (ref 13.0–17.0)
MCH: 27.6 pg (ref 26.0–34.0)
MCHC: 31.9 g/dL (ref 30.0–36.0)
MCV: 86.4 fL (ref 78.0–100.0)
Platelets: 109 10*3/uL — ABNORMAL LOW (ref 150–400)
RBC: 5.15 MIL/uL (ref 4.22–5.81)
RDW: 15 % (ref 11.5–15.5)
WBC: 16.3 10*3/uL — ABNORMAL HIGH (ref 4.0–10.5)

## 2015-09-26 LAB — MRSA PCR SCREENING: MRSA by PCR: NEGATIVE

## 2015-09-26 LAB — LACTIC ACID, PLASMA
LACTIC ACID, VENOUS: 10.2 mmol/L — AB (ref 0.5–1.9)
LACTIC ACID, VENOUS: 16.4 mmol/L — AB (ref 0.5–1.9)
LACTIC ACID, VENOUS: 9.4 mmol/L — AB (ref 0.5–1.9)
Lactic Acid, Venous: 9.7 mmol/L (ref 0.5–1.9)

## 2015-09-26 LAB — AMMONIA: Ammonia: 46 umol/L — ABNORMAL HIGH (ref 9–35)

## 2015-09-26 LAB — ACETAMINOPHEN LEVEL: Acetaminophen (Tylenol), Serum: <10 ug/mL — ABNORMAL LOW (ref 10–30)

## 2015-09-26 MED ORDER — SODIUM BICARBONATE 8.4 % IV SOLN
100.0000 meq | Freq: Once | INTRAVENOUS | Status: AC
  Administered 2015-09-26: 100 meq via INTRAVENOUS
  Filled 2015-09-26: qty 100

## 2015-09-26 MED ORDER — SODIUM BICARBONATE 8.4 % IV SOLN
INTRAVENOUS | Status: AC
  Administered 2015-09-26: 50 meq
  Filled 2015-09-26: qty 100

## 2015-09-26 MED ORDER — SODIUM CHLORIDE 0.9 % IV BOLUS (SEPSIS)
500.0000 mL | Freq: Once | INTRAVENOUS | Status: AC
  Administered 2015-09-26: 500 mL via INTRAVENOUS

## 2015-09-26 MED ORDER — METOPROLOL TARTRATE 5 MG/5ML IV SOLN: 2.5000 mg | INTRAVENOUS | Status: DC | PRN

## 2015-09-26 MED ORDER — ACETYLCYSTEINE LOAD VIA INFUSION
150.0000 mg/kg | Freq: Once | INTRAVENOUS | Status: DC
  Filled 2015-09-26: qty 369

## 2015-09-26 MED ORDER — SODIUM BICARBONATE 8.4 % IV SOLN
INTRAVENOUS | Status: DC
  Administered 2015-09-26 (×2): via INTRAVENOUS
  Filled 2015-09-26 (×3): qty 150

## 2015-09-26 MED ORDER — FUROSEMIDE 10 MG/ML IJ SOLN
40.0000 mg | Freq: Once | INTRAMUSCULAR | Status: AC
  Administered 2015-09-26: 40 mg via INTRAVENOUS
  Filled 2015-09-26: qty 4

## 2015-09-26 MED ORDER — MIDAZOLAM HCL 2 MG/2ML IJ SOLN: 1.0000 mg | INTRAMUSCULAR | Status: DC | PRN

## 2015-09-26 MED ORDER — SODIUM CHLORIDE 0.9 % IV BOLUS (SEPSIS)
1000.0000 mL | Freq: Once | INTRAVENOUS | Status: AC
  Administered 2015-09-26: 1000 mL via INTRAVENOUS

## 2015-09-26 MED ORDER — SODIUM BICARBONATE 8.4 % IV SOLN
INTRAVENOUS | Status: AC
  Administered 2015-09-26: 100 meq via INTRAVENOUS
  Filled 2015-09-26: qty 100

## 2015-09-26 MED ORDER — FENTANYL CITRATE (PF) 100 MCG/2ML IJ SOLN
INTRAMUSCULAR | Status: AC
  Filled 2015-09-26: qty 2

## 2015-09-26 MED ORDER — DEXTROSE 50 % IV SOLN
INTRAVENOUS | Status: AC
  Administered 2015-09-26: 50 mL via INTRAVENOUS
  Filled 2015-09-26: qty 50

## 2015-09-26 MED ORDER — SODIUM BICARBONATE 8.4 % IV SOLN
100.0000 meq | Freq: Once | INTRAVENOUS | Status: AC
  Administered 2015-09-26: 50 meq via INTRAVENOUS

## 2015-09-26 MED ORDER — DEXTROSE 5 % IV SOLN
15.0000 mg/kg/h | INTRAVENOUS | Status: DC
  Filled 2015-09-26: qty 200

## 2015-09-26 MED ORDER — VITAMIN K1 10 MG/ML IJ SOLN
5.0000 mg | Freq: Once | INTRAVENOUS | Status: AC
  Administered 2015-09-26: 5 mg via INTRAVENOUS
  Filled 2015-09-26: qty 0.5

## 2015-09-26 MED ORDER — ACETYLCYSTEINE LOAD VIA INFUSION
150.0000 mg/kg | Freq: Once | INTRAVENOUS | Status: AC
  Administered 2015-09-26: 14730 mg via INTRAVENOUS
  Filled 2015-09-26: qty 369

## 2015-09-26 MED ORDER — ROCURONIUM BROMIDE 50 MG/5ML IV SOLN
80.0000 mg | Freq: Once | INTRAVENOUS | Status: AC
  Administered 2015-09-26: 80 mg via INTRAVENOUS
  Filled 2015-09-26: qty 8

## 2015-09-26 MED ORDER — SODIUM POLYSTYRENE SULFONATE 15 GM/60ML PO SUSP
30.0000 g | Freq: Once | ORAL | Status: AC
  Administered 2015-09-26: 30 g via ORAL
  Filled 2015-09-26: qty 120

## 2015-09-26 MED ORDER — ORAL CARE MOUTH RINSE
15.0000 mL | Freq: Four times a day (QID) | OROMUCOSAL | Status: DC
  Administered 2015-09-26: 15 mL via OROMUCOSAL

## 2015-09-26 MED ORDER — INSULIN ASPART 100 UNIT/ML ~~LOC~~ SOLN: 2.0000 [IU] | SUBCUTANEOUS | Status: DC

## 2015-09-26 MED ORDER — ETOMIDATE 2 MG/ML IV SOLN
20.0000 mg | Freq: Once | INTRAVENOUS | Status: AC
  Administered 2015-09-26: 20 mg via INTRAVENOUS

## 2015-09-26 MED ORDER — FAMOTIDINE IN NACL 20-0.9 MG/50ML-% IV SOLN
20.0000 mg | INTRAVENOUS | Status: DC
  Administered 2015-09-26: 20 mg via INTRAVENOUS
  Filled 2015-09-26: qty 50

## 2015-09-26 MED ORDER — FENTANYL CITRATE (PF) 100 MCG/2ML IJ SOLN: 50.0000 ug | INTRAMUSCULAR | Status: DC | PRN

## 2015-09-26 MED ORDER — MIDAZOLAM HCL 2 MG/2ML IJ SOLN
INTRAMUSCULAR | Status: AC
  Filled 2015-09-26: qty 2

## 2015-09-26 MED ORDER — DEXTROSE 50 % IV SOLN
1.0000 | Freq: Once | INTRAVENOUS | Status: AC
  Administered 2015-09-26: 50 mL via INTRAVENOUS

## 2015-09-26 MED ORDER — CHLORHEXIDINE GLUCONATE 0.12% ORAL RINSE (MEDLINE KIT)
15.0000 mL | Freq: Two times a day (BID) | OROMUCOSAL | Status: DC
  Administered 2015-09-26: 15 mL via OROMUCOSAL

## 2015-09-26 MED ORDER — SODIUM BICARBONATE 8.4 % IV SOLN
INTRAVENOUS | Status: AC
Start: 2015-09-26 — End: 2015-09-26
  Administered 2015-09-26: 50 meq via INTRAVENOUS
  Filled 2015-09-26: qty 50

## 2015-09-26 MED ORDER — DEXTROSE 5 % IV SOLN
15.0000 mg/kg/h | INTRAVENOUS | Status: DC
  Administered 2015-09-26: 15 mg/kg/h via INTRAVENOUS
  Filled 2015-09-26: qty 200

## 2015-09-26 NOTE — Progress Notes (Signed)
eLink Physician-Brief Progress Note Patient Name: Alan Miranda DOB: 1934-04-28 MRN: [id number]   Date of Service  09/26/2015  HPI/Events of Note  Acute on chronic liver failure - Dr. Ulyses JarredMcQuaid's note speaks of n-aceytalcystine IV infusion. No orders for infusion.  eICU Interventions  Will order: 1. n-acetylcystine IV infusion per pharmacy consult.  2. Tylenol level now.      Intervention Category Major Interventions: Other:  Lenell AntuSommer,Zilpha Mcandrew Eugene 09/26/2015, 8:34 PM

## 2015-09-26 NOTE — Progress Notes (Signed)
ABG collected  

## 2015-09-26 NOTE — Progress Notes (Signed)
CRITICAL VALUE ALERT  Critical value received:  Lactic acid  Date of notification:  09/26/15  Time of notification:  0127  Critical value read back: yes  Nurse who received alert:  Kizzie BaneAmy Josslyn Ciolek, RN  MD notified (1st page):  Craige CottaKirby  Time of first page:  0128  Responding MD:  Craige CottaKirby  Time MD responded:  [phone]

## 2015-09-26 NOTE — Progress Notes (Addendum)
eLink Physician-Brief Progress Note Patient Name: Alan Miranda DOB: [birth year] MRN: [id number]   Date of Service  09/26/2015  HPI/Events of Note  Multiple issues: 1. ABG on 40%/PRVC 30/TV 600/P 5 = 7.15/20.3/103/6.8 and 2. Hypoglycemia - blood glucose = 45. Already treated with D50.  eICU Interventions  Will order: 1. NaHCO3 100 meq IV now. 2. Increase D5 150 meq NaHCO3/liter IV infusion to 125 mL/hour. 3. ABG at 7:30 PM.     Intervention Category Major Interventions: Acid-Base disturbance - evaluation and management;Other:  Lenell AntuSommer,Steven Eugene 09/26/2015, 5:29 PM

## 2015-09-26 NOTE — Progress Notes (Signed)
Pharmacy N-acetylcysteine Consult  Alan Miranda is a 81 y.o. male admitted on 10/05/2015 with generalized weakness.  Pharmacy has been consulted for n-acetylcysteine dosing not thought to be secondary to acetaminophen overdose. The recommendation to was given by the Transplant team at [facility]. Liver enzymes are extremely elevated: Alk phos 325, AST 5200, ALT 3040, T. Bili 5.8.  [name] recommended checking daily LFT's and INR, no need to get acetaminophen levels.  Plan: Load: IV '150mg'$ /kg (15,'000mg'$ ) infused over 1 hour Continuous Infusion: '15mg'$ /kg (1,'500mg'$ ) until clinical improvement Monitor Liver function and INR  Height: '5\' 11"'$  (180.3 cm) Weight: 216 lb 7.9 oz (98.2 kg) IBW/kg (Calculated) : 75.3  Temp (24hrs), Avg:97 F (36.1 C), Min:95.8 F (35.4 C), Max:97.7 F (36.5 C)   Recent Labs Lab 09/24/2015 0747 09/20/2015 1240  09/17/2015 2005 09/26/15 0040 09/26/15 0306 09/26/15 0936 09/26/15 1636 09/26/15 1846  WBC  --  18.1*  --   --   --  16.3*  --   --   --   CREATININE 1.43* 2.04*  --   --   --  1.97*  --   --  2.48*  LATICACIDVEN  --   --   < > 8.9* 9.4* 9.7* 10.2* 16.4*  --   < > = values in this interval not displayed.  Estimated Creatinine Clearance: 29.3 mL/min (by C-G formula based on SCr of 2.48 mg/dL (H)).   Allergies  Allergen Reactions  . Tetracycline Nausea Only    Thank you for allowing pharmacy to be a part of this patient's care.  Jodean Lima Debroah Shuttleworth 09/26/2015 8:45 PM

## 2015-09-26 NOTE — Procedures (Signed)
Intubation Procedure Note Alan Miranda [id number] 1934-11-08  Procedure: Intubation Indications: Respiratory insufficiency  Procedure Details Consent: Unable to obtain consent because of emergent medical necessity. Time Out: Verified patient identification, verified procedure, site/side was marked, verified correct patient position, special equipment/implants available, medications/allergies/relevent history reviewed, required imaging and test results available.  Performed  Maximum sterile technique was used including gloves, gown, hand hygiene and mask.  MAC and 3    Evaluation Hemodynamic Status: BP stable throughout; O2 sats: stable throughout Patient's Current Condition: stable Complications: No apparent complications Patient did tolerate procedure well. Chest X-ray ordered to verify placement.  CXR: pending.  Pt intubated using glidescope # 4 blade with 7.5 ett secured at 23 at the lip. Positive color change noted on etco2, bilateral bs throughout, direct visualization, CXR pending. Pt stable throughout with no complications. RT will continue to monitor   Carolan ShiverKelley, Tanmay Halteman M 09/26/2015

## 2015-09-26 NOTE — Progress Notes (Signed)
Duplicate entry  Situation discussed with bedside, lactic acid worsening despite normal blood pressure.  [facility] not a transplant candidate.  They recommended supportive care.  I explained the situation to the patient's wife in detail and she understood.  We will start n-acetylcystine and will make his code status DNR given his grim prognosis.  His wife is in agreement.

## 2015-09-26 NOTE — Progress Notes (Signed)
CRITICAL VALUE ALERT  Critical value received:  Lactic Acid 16.4  Date of notification:  9/19 Time of notification:  1820  Critical value read back: Yes  Nurse who received alert: Hazel Samshristian Adamari Frede RN    MD notified (1st page):  NP Joneen RoachPaul Hoffman  Time of first page:  NP on unit 1820  MD notified (2nd page):  Time of second page:  Responding MD:  NP Joneen RoachPaul Hoffman   Time MD responded: [phone]

## 2015-09-26 NOTE — Consult Note (Signed)
PULMONARY / CRITICAL CARE MEDICINE   Name: Alan Miranda MRN: [id number] DOB: [birth year]    ADMISSION DATE:  09/16/2015 CONSULTATION DATE:  09/26/2015  REFERRING MD:  Dr. Sharl Ma  CHIEF COMPLAINT:  Acute hepatic failure  HISTORY OF PRESENT ILLNESS:   81 year old male with PMH as below, which is significant for CAD, DM2 on metformin, HLD on atorvastatin, and HTN. He was in his usual state of health (which is very independent and active lifestyle) until about 2 weeks ago when he started complaining of "side pain" (wife unable to lateralize) and general malaise. He was seen by his PCP for this and underwent abdominal ultrasound, which was concerning for gallstones suspicious for cholecystitis. ABX were started and he was to be set up for outpatient surgical consultation. 9/15 his symptoms worsened, mainly weakness and malaise. He presented to PCP and was sent home. 9/18 he presented to [facility] Emergency Department where his workup was suspicious for severe sepsis with suspected intra-abdominal source. LA 13.5 on admit with AKI and profound elevation in LFTs. CT abd demonstrated large hepatic mass, which has been deemed unlikely to cause these symptoms with such severity. Not easily resectable and not a candidate for surgery at this time. He was admitted to the hospitalist team and was treated with broad spectrum antibiotics. 9/19 confusion and respiratory status worsened. PCCM consulted.    PAST MEDICAL HISTORY :  He  has a past medical history of CAD (coronary artery disease); DM2 (diabetes mellitus, type 2) (HCC); Dyslipidemia; and Hypertension.  PAST SURGICAL HISTORY: He  has a past surgical history that includes Cardiac surgery; Appendectomy; and Inguinal hernia repair (Bilateral).  Allergies  Allergen Reactions  . Tetracycline Nausea Only    No current facility-administered medications on file prior to encounter.    No current outpatient prescriptions on file prior to encounter.     FAMILY HISTORY:  His has no family status information on file.    SOCIAL HISTORY: He  reports that he has never smoked. He has never used smokeless tobacco. He reports that he does not drink alcohol.  REVIEW OF SYSTEMS:   Unable due to encephalopathy  SUBJECTIVE:    VITAL SIGNS: BP 96/72   Pulse 87   Temp 97.3 F (36.3 C) (Axillary)   Resp (!) 29   Ht 5\' 10"  (1.778 m)   Wt 96.1 kg (211 lb 13.8 oz)   SpO2 99%   BMI 30.40 kg/m   HEMODYNAMICS:    VENTILATOR SETTINGS: FiO2 (%):  [0 %] 0 %  INTAKE / OUTPUT: I/O last 3 completed shifts: In: 3721.3 [I.V.:3521.3; IV Piggyback:200] Out: 150 [Urine:150]  PHYSICAL EXAMINATION: General:  Elderly male in respiratory distress Neuro:  Alert to verbal, oriented to self.  HEENT:  Magnet/AT, PERRL, mild conjunctival jaundice.  Cardiovascular:  Tachy, regular Lungs:  Clear bilateral breath sound. Tachypnea with shallow respirations.  Abdomen:  Soft, non-tender, mild distension. Musculoskeletal:  No acute deformity. BLE edema R>L Skin:  Grossly intact  LABS:  BMET  Recent Labs Lab 09/09/2015 0747 09/20/2015 1240 09/26/15 0306  NA 131* 132* 131*  K 5.0 5.3* 5.1  CL 92* 95* 100*  CO2 19* 15* 11*  BUN 47* 49* 55*  CREATININE 1.43* 2.04* 1.97*  GLUCOSE 152* 143* 111*    Electrolytes  Recent Labs Lab 09/17/2015 0747 09/30/2015 1240 09/26/15 0306  CALCIUM 9.0 9.1 7.9*    CBC  Recent Labs Lab 09/30/2015 1240 09/26/15 0306  WBC 18.1* 16.3*  HGB 16.2  14.2  HCT 50.4 44.5  PLT 160 109*    Coag's  Recent Labs Lab 09/28/2015 1303 2015/09/28 1703  APTT  --  40*  INR 3.18  --     Sepsis Markers  Recent Labs Lab 09/28/2015 1703  09/26/15 0040 09/26/15 0306 09/26/15 0936  LATICACIDVEN 9.1*  < > 9.4* 9.7* 10.2*  PROCALCITON 1.05  --   --   --   --   < > = values in this interval not displayed.  ABG No results for input(s): PHART, PCO2ART, PO2ART in the last 168 hours.  Liver Enzymes  Recent Labs Lab  28-Sep-2015 1240 09/26/15 0638  AST 3,645* 4,419*  ALT 2,910* 3,040*  ALKPHOS 298* 307*  BILITOT 4.5* 5.1*  ALBUMIN 3.6 3.1*    Cardiac Enzymes  Recent Labs Lab Sep 28, 2015 1240  TROPONINI 0.05*    Glucose  Recent Labs Lab 2015-09-28 2134 09/28/2015 2340 09/26/15 0338 09/26/15 0620 09/26/15 1006  GLUCAP 128* 114* 99 89 81    Imaging Ct Abdomen Pelvis Wo Contrast  Result Date: Sep 28, 2015 CLINICAL DATA:  Evaluate for liver abnormality.  Sepsis. EXAM: CT CHEST, ABDOMEN AND PELVIS WITHOUT CONTRAST TECHNIQUE: Multidetector CT imaging of the chest, abdomen and pelvis was performed following the standard protocol without IV contrast. COMPARISON:  Abdominal ultrasound 4 days ago FINDINGS: CT CHEST FINDINGS Cardiovascular: Normal heart size. Small low-density pericardial effusion. Fibro fatty scarring in the anterior wall and septum of the left ventricle. Extensive coronary atherosclerotic calcification. No evidence of acute vascular disease. Mediastinum:  Negative for adenopathy.  Small hiatal hernia. Lungs/Pleura: Motion degraded. Small layering right pleural effusion that is water density. Trace left pleural effusion. There is no edema, consolidation, or pneumothorax. Musculoskeletal: Anasarca.  No acute or aggressive finding. CT ABDOMEN PELVIS FINDINGS Hepatobiliary: Heterogeneous caudate lobe mass imaging of the 10 cm on reformats. No background cirrhosis is noted. The liver appears generally steatotic. Few other hepatic low densities are likely cystic. The mass displaces at least the main portal vein which shows normal density and no expansion. Cholelithiasis. There is mild edema around the gallbladder, similar to elsewhere, without suspect wall thickening over distension. No common bile duct dilatation or signs of choledocholithiasis Pancreas: Unremarkable. Spleen: Unremarkable. Adrenals/Urinary Tract: Negative adrenals. No hydronephrosis or stone. Exophytic right renal cyst as confirmed on  previous ultrasound. 2 cm parenchymal low-density area in the right kidney is also cystic density. Unremarkable bladder. Stomach/Bowel: No obstruction. No inflammatory changes. Mild colonic diverticulosis. Vascular/Lymphatic: No acute vascular finding. Retroperitoneal edema is likely from volume overload. Atherosclerotic calcification of the aorta and branch vessels. No visualized adenopathy. Reproductive:No pathologic findings. Other: Small non loculated ascites. Probable previous inguinal hernia repairs. Musculoskeletal: No acute or aggressive finding.  Anasarca. 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 Canal stenosis that is greatest at L3-4. IMPRESSION: 1. 10 cm liver mass at the caudate. When renal function permits, recommend enhanced liver MRI. 2. Cholelithiasis without suspected cholecystitis. 3. Volume overload with anasarca, small pleural effusions, and small ascites. 4. Remote left ventricular infarct. Electronically Signed   By: Marnee Spring M.D.   On: 09/28/15 17:25   Dg Chest 2 View  Result Date: September 28, 2015 CLINICAL DATA:  Generalized weakness and shortness of Breath EXAM: CHEST  2 VIEW COMPARISON:  07/07/2009 FINDINGS: Cardiac shadow is within normal limits. The lungs are poorly aerated although no focal infiltrate is seen. Small bilateral pleural effusions are noted. No bony abnormality noted. IMPRESSION: Bilateral pleural effusions Electronically Signed  By: Alcide CleverMark  Lukens M.D.   On: 09/10/2015 14:55   Ct Chest Wo Contrast  Result Date: 09/16/2015 CLINICAL DATA:  Evaluate for liver abnormality.  Sepsis. EXAM: CT CHEST, ABDOMEN AND PELVIS WITHOUT CONTRAST TECHNIQUE: Multidetector CT imaging of the chest, abdomen and pelvis was performed following the standard protocol without IV contrast. COMPARISON:  Abdominal ultrasound 4 days ago FINDINGS: CT CHEST FINDINGS Cardiovascular: Normal heart size. Small low-density pericardial effusion. Fibro fatty  scarring in the anterior wall and septum of the left ventricle. Extensive coronary atherosclerotic calcification. No evidence of acute vascular disease. Mediastinum:  Negative for adenopathy.  Small hiatal hernia. Lungs/Pleura: Motion degraded. Small layering right pleural effusion that is water density. Trace left pleural effusion. There is no edema, consolidation, or pneumothorax. Musculoskeletal: Anasarca.  No acute or aggressive finding. CT ABDOMEN PELVIS FINDINGS Hepatobiliary: Heterogeneous caudate lobe mass imaging of the 10 cm on reformats. No background cirrhosis is noted. The liver appears generally steatotic. Few other hepatic low densities are likely cystic. The mass displaces at least the main portal vein which shows normal density and no expansion. Cholelithiasis. There is mild edema around the gallbladder, similar to elsewhere, without suspect wall thickening over distension. No common bile duct dilatation or signs of choledocholithiasis Pancreas: Unremarkable. Spleen: Unremarkable. Adrenals/Urinary Tract: Negative adrenals. No hydronephrosis or stone. Exophytic right renal cyst as confirmed on previous ultrasound. 2 cm parenchymal low-density area in the right kidney is also cystic density. Unremarkable bladder. Stomach/Bowel: No obstruction. No inflammatory changes. Mild colonic diverticulosis. Vascular/Lymphatic: No acute vascular finding. Retroperitoneal edema is likely from volume overload. Atherosclerotic calcification of the aorta and branch vessels. No visualized adenopathy. Reproductive:No pathologic findings. Other: Small non loculated ascites. Probable previous inguinal hernia repairs. Musculoskeletal: No acute or aggressive finding.  Anasarca. 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 Canal stenosis that is greatest at L3-4. IMPRESSION: 1. 10 cm liver mass at the caudate. When renal function permits, recommend enhanced liver MRI. 2. Cholelithiasis  without suspected cholecystitis. 3. Volume overload with anasarca, small pleural effusions, and small ascites. 4. Remote left ventricular infarct. Electronically Signed   By: Marnee SpringJonathon  Watts M.D.   On: 10/02/2015 17:25   Dg Chest Port 1 View  Result Date: 09/26/2015 CLINICAL DATA:  Shortness of Breath EXAM: PORTABLE CHEST 1 VIEW COMPARISON:  Chest radiograph September 25, 2015; chest CT September 25, 2015 FINDINGS: There is a right pleural effusion which has a subpulmonic component. There is subtle patchy opacity in the left base behind the left heart. Lungs elsewhere clear. Heart size and pulmonary vascularity are normal. No adenopathy. No bone lesions. IMPRESSION: Subpulmonic appearing pleural effusion on the right. Patchy opacity behind the left heart, concerning for early pneumonia left base. Lungs elsewhere clear. Stable cardiac silhouette. Electronically Signed   By: Bretta BangWilliam  Woodruff III M.D.   On: 09/26/2015 14:13    STUDIES:  CT abd 9/18 > 10 cm liver mass at the caudate. When renal function permits, recommend enhanced liver MRI. Cholelithiasis without suspected cholecystitis. Volume overload with anasarca, small pleural effusions, and small ascites. Remote left ventricular infarct.  CULTURES: Blood 9/18 > Urine 9/18 >  ANTIBIOTICS: Zosyn 9/18 >  Vancomycin  9/18   SIGNIFICANT EVENTS: 9/18 > admit 9/19 > ICU transfer, Intubation  LINES/TUBES: ETT 9/19 >  DISCUSSION: 81 year old male admitted 9/18 with sepsis-like syndrome, Acute hepatic failure, and renal failure. CT with large liver mass not felt to be fully causing these symptoms. Coarse worsened 9/19 with  respiratory compromise and he transferred to the ICU for intubation and stabilization.  ASSESSMENT / PLAN:  PULMONARY A: Relative hypoventilation in the setting of severe metabolic acidosis R effusion  P:   STAT intubation Full vent support with high MV CXR for ETT ABG VAP bundle  CARDIOVASCULAR A:  Acute  systolic/diastolic CHF in the setting of volume overload. Hx CAD, HTN, HLD  P:  Telemetry monitoring KVO IVF with exception of NaHCO3 Holding atorvastatin in setting hepatic failure Holding home metoprolol, will make available PRN  RENAL A:   Acute renal failure likely secondary to hepatorenal syndrome  P:   KVO IVF  GASTROINTESTINAL A:   Acute hepatic failure > etiology not entirely clear. 10cm hepatic mass identified, however, surgery feels this is unlikely to be a significant contributor. He also is on a statin, but has been for some time. Glimepiride and metformin are also potentially hepatotoxic.He does use tylenol with some frequency, but as reported is far below the daily allowances.  Cholelithiasis without cholecystitis based on CT  P:   NPO Pepcid for SUP GI and CCS following Hold home atorvastatin, glipizide, metformin ABX as below Trend LFT  HEMATOLOGIC A:   Coagulopathy in the setting of hepatic failure  P:  Follow CBC Give Vitamin K Consider FFP before any procedure  INFECTIOUS A:   Severe sepsis, unclear etiology. Likely intraabdominal source.   P:   Follow cultures ABX as above Consider diagnostic paracentesis once more stable  ENDOCRINE A:   DM  P:   CBG monitoring and SSI  NEUROLOGIC A:   Acute metabolic encephalopathy  P:   RASS goal: -1 to -2 PRN fentanyl PRN versed    FAMILY  - Updates: Family updated bedside by BQ 9/19  - Inter-disciplinary family meet or Palliative Care meeting due by:  9/26   Joneen Roach, AGACNP-BC [facility] Pager [phone] or [phone]  09/26/2015 3:36 PM

## 2015-09-26 NOTE — Progress Notes (Signed)
Called by  RN, the patient has persistent elevated lactic acid. Last lactate 10.2 this morning.  "Came to evaluate patient, and found to be in mild respiratory distress. [facility] Vitals are stable, BP 129/87. Chest- decreased breath sounds bilaterally  Assessment Acute hypoxic respiratory failure- likely from pulmonary edema. Will obtain stat chest x-ray, BNP. We will give 1 dose of Lasix 40 mg IV 1 I called intensivist on call Dr. Kendrick FriesMcquaid, who is going to come and see the patient.  Lactic acidosis- yesterday bicarbonate was 19, today bicarbonate is 11. Check ABG Does not seem to be infectious in etiology, likely from liver failure. Liver failure can cause type B lactic acidosis  Discussed in detail with patient's daughters at bedside.

## 2015-09-26 NOTE — Progress Notes (Signed)
RT note- no ventilator changes post ABG, continue to monitor.

## 2015-09-26 NOTE — Progress Notes (Signed)
Subjective: No abdominal pain.  Still feels very tired.  He says he had a BM and voided together.  Sats are good supine with Wilder.  Family in the room and feel he is worse.    Objective: Vital signs in last 24 hours: Temp:  [96.2 F (35.7 C)-97.7 F (36.5 C)] 97.3 F (36.3 C) (09/19 0624) Pulse Rate:  [40-94] 81 (09/19 0314) Resp:  [13-25] 18 (09/19 0314) BP: (110-165)/(67-99) 122/91 (09/19 0314) SpO2:  [94 %-100 %] 99 % (09/19 0314) FiO2 (%):  [0 %] 0 % (09/18 2130) Weight:  [86.2 kg (190 lb)-96.1 kg (211 lb 13.8 oz)] 96.1 kg (211 lb 13.8 oz) (09/19 0624) Last BM Date: 17-Oct-2015 3500 IV fluids Urine 150 recorded Afebrile, diastolic BP is up  Labs No significant change,  Bilirubin is up  AST/ALT are also rising, I do not see an INR.  Recent Labs Lab Oct 17, 2015 1240 09/26/15 0638  AST 3,645* 4,419*  ALT 2,910* 3,040*  ALKPHOS 298* 307*  BILITOT 4.5* 5.1*  PROT 6.5 5.5*  ALBUMIN 3.6 3.1*    CT chest and abd:10 CM liver mass, no cirrhosis; cholelithiasis with edema around the GB. No CBD dilatation or choledocholithiasis noted. Volume overload with anasarca and small pleural effusions.  Intake/Output from previous day: 09/18 0701 - 09/19 0700 In: 3721.3 [I.V.:3521.3; IV Piggyback:200] Out: 150 [Urine:150] Intake/Output this shift: No intake/output data recorded.  General appearance: alert, cooperative and fatigued Resp: clear to auscultation bilaterally GI: soft nontender, + BS, no distension or discomfort.    Lab Results:   Recent Labs  2015-10-17 1240 09/26/15 0306  WBC 18.1* 16.3*  HGB 16.2 14.2  HCT 50.4 44.5  PLT 160 109*    BMET  Recent Labs  17-Oct-2015 1240 09/26/15 0306  NA 132* 131*  K 5.3* 5.1  CL 95* 100*  CO2 15* 11*  GLUCOSE 143* 111*  BUN 49* 55*  CREATININE 2.04* 1.97*  CALCIUM 9.1 7.9*   PT/INR  Recent Labs  Oct 17, 2015 1303  LABPROT 33.3*  INR 3.18     Recent Labs Lab October 17, 2015 1240  AST 3,645*  ALT 2,910*  ALKPHOS 298*   BILITOT 4.5*  PROT 6.5  ALBUMIN 3.6     Lipase     Component Value Date/Time   LIPASE 64 (H) 10/17/2015 1240     Studies/Results: Ct Abdomen Pelvis Wo Contrast  Result Date: 17-Oct-2015 CLINICAL DATA:  Evaluate for liver abnormality.  Sepsis. EXAM: CT CHEST, ABDOMEN AND PELVIS WITHOUT CONTRAST TECHNIQUE: Multidetector CT imaging of the chest, abdomen and pelvis was performed following the standard protocol without IV contrast. COMPARISON:  Abdominal ultrasound 4 days ago FINDINGS: CT CHEST FINDINGS Cardiovascular: Normal heart size. Small low-density pericardial effusion. Fibro fatty scarring in the anterior wall and septum of the left ventricle. Extensive coronary atherosclerotic calcification. No evidence of acute vascular disease. Mediastinum:  Negative for adenopathy.  Small hiatal hernia. Lungs/Pleura: Motion degraded. Small layering right pleural effusion that is water density. Trace left pleural effusion. There is no edema, consolidation, or pneumothorax. Musculoskeletal: Anasarca.  No acute or aggressive finding. CT ABDOMEN PELVIS FINDINGS Hepatobiliary: Heterogeneous caudate lobe mass imaging of the 10 cm on reformats. No background cirrhosis is noted. The liver appears generally steatotic. Few other hepatic low densities are likely cystic. The mass displaces at least the main portal vein which shows normal density and no expansion. Cholelithiasis. There is mild edema around the gallbladder, similar to elsewhere, without suspect wall thickening over distension. No common bile duct  dilatation or signs of choledocholithiasis Pancreas: Unremarkable. Spleen: Unremarkable. Adrenals/Urinary Tract: Negative adrenals. No hydronephrosis or stone. Exophytic right renal cyst as confirmed on previous ultrasound. 2 cm parenchymal low-density area in the right kidney is also cystic density. Unremarkable bladder. Stomach/Bowel: No obstruction. No inflammatory changes. Mild colonic diverticulosis.  Vascular/Lymphatic: No acute vascular finding. Retroperitoneal edema is likely from volume overload. Atherosclerotic calcification of the aorta and branch vessels. No visualized adenopathy. Reproductive:No pathologic findings. Other: Small non loculated ascites. Probable previous inguinal hernia repairs. Musculoskeletal: No acute or aggressive finding.  Anasarca. 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 Canal stenosis that is greatest at L3-4. IMPRESSION: 1. 10 cm liver mass at the caudate. When renal function permits, recommend enhanced liver MRI. 2. Cholelithiasis without suspected cholecystitis. 3. Volume overload with anasarca, small pleural effusions, and small ascites. 4. Remote left ventricular infarct. Electronically Signed   By: Marnee Spring M.D.   On: 2015-09-28 17:25   Dg Chest 2 View  Result Date: 2015-09-28 CLINICAL DATA:  Generalized weakness and shortness of Breath EXAM: CHEST  2 VIEW COMPARISON:  07/07/2009 FINDINGS: Cardiac shadow is within normal limits. The lungs are poorly aerated although no focal infiltrate is seen. Small bilateral pleural effusions are noted. No bony abnormality noted. IMPRESSION: Bilateral pleural effusions Electronically Signed   By: Alcide Clever M.D.   On: 09/28/2015 14:55   Ct Chest Wo Contrast  Result Date: 09-28-2015 CLINICAL DATA:  Evaluate for liver abnormality.  Sepsis. EXAM: CT CHEST, ABDOMEN AND PELVIS WITHOUT CONTRAST TECHNIQUE: Multidetector CT imaging of the chest, abdomen and pelvis was performed following the standard protocol without IV contrast. COMPARISON:  Abdominal ultrasound 4 days ago FINDINGS: CT CHEST FINDINGS Cardiovascular: Normal heart size. Small low-density pericardial effusion. Fibro fatty scarring in the anterior wall and septum of the left ventricle. Extensive coronary atherosclerotic calcification. No evidence of acute vascular disease. Mediastinum:  Negative for adenopathy.  Small hiatal  hernia. Lungs/Pleura: Motion degraded. Small layering right pleural effusion that is water density. Trace left pleural effusion. There is no edema, consolidation, or pneumothorax. Musculoskeletal: Anasarca.  No acute or aggressive finding. CT ABDOMEN PELVIS FINDINGS Hepatobiliary: Heterogeneous caudate lobe mass imaging of the 10 cm on reformats. No background cirrhosis is noted. The liver appears generally steatotic. Few other hepatic low densities are likely cystic. The mass displaces at least the main portal vein which shows normal density and no expansion. Cholelithiasis. There is mild edema around the gallbladder, similar to elsewhere, without suspect wall thickening over distension. No common bile duct dilatation or signs of choledocholithiasis Pancreas: Unremarkable. Spleen: Unremarkable. Adrenals/Urinary Tract: Negative adrenals. No hydronephrosis or stone. Exophytic right renal cyst as confirmed on previous ultrasound. 2 cm parenchymal low-density area in the right kidney is also cystic density. Unremarkable bladder. Stomach/Bowel: No obstruction. No inflammatory changes. Mild colonic diverticulosis. Vascular/Lymphatic: No acute vascular finding. Retroperitoneal edema is likely from volume overload. Atherosclerotic calcification of the aorta and branch vessels. No visualized adenopathy. Reproductive:No pathologic findings. Other: Small non loculated ascites. Probable previous inguinal hernia repairs. Musculoskeletal: No acute or aggressive finding.  Anasarca. 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 Canal stenosis that is greatest at L3-4. IMPRESSION: 1. 10 cm liver mass at the caudate. When renal function permits, recommend enhanced liver MRI. 2. Cholelithiasis without suspected cholecystitis. 3. Volume overload with anasarca, small pleural effusions, and small ascites. 4. Remote left ventricular infarct. Electronically Signed   By: Marnee Spring M.D.   On:  02/08/2015 17:25    Medications: . heparin  5,000 Units Subcutaneous Q8H  . insulin aspart  0-9 Units Subcutaneous Q6H  . pantoprazole  40 mg Oral Daily  . piperacillin-tazobactam (ZOSYN)  IV  3.375 g Intravenous Q8H  . sodium chloride flush  3 mL Intravenous Q12H   . sodium chloride 75 mL/hr at 2015/09/17 2351    AODM Hypertension CAD with prior stent 2005 Dyslipidemia  Assessment/Plan Cholelithiasis, possible cholangitis, cholecystitis Liver disease vs Metastatic disease - acute liver failure Acute renal failure Hyponatremia FEN: IV fluids/NPO ID: Zosyn day 2; Vancomycin 1 gm yesterday DVT: Heparin   Plan:  Will review with Dr. Janee Mornhompson and Dr. Donell BeersByerly.  Cancer antigens, hepatitis panel pending.  Continue antibiotics.    Dr. Donell BeersByerly has reviewed the CT scans. "Liver mass probably not abscess and probably real neoplasm. Doesn't look like cause of sepsis since no dilated biliary tree."  She recommends patient  recover renal function, obtain an MRI, and biopsy as necessary.  Tumor markers are pending, colonoscopy, if that has not been done. He would most likely not be a surgical resection candidate.  Dr. Donell BeersByerly can see him as an outpatient once he is over this acute illness.  We will continue to follow here during this acute phase.  CCM is planning to transfer to ICU for worsening condition    LOS: 1 day    Davonna Ertl 09/26/2015 [phone]

## 2015-09-26 NOTE — Progress Notes (Signed)
CRITICAL VALUE ALERT  Critical value received:  LA 16.4  Date of notification:  09/26/15  Time of notification:  1820  Critical value read back:Yes.    Nurse who received alert:  Hazel Samshristian Smith, RN  MD notified (1st page):  Joneen RoachPaul Hoffman, NP  Time of first page:  1825  MD notified (2nd page):  Time of second page:  Responding MD:  Joneen RoachPaul Hoffman, NP  Time MD responded:  [phone]

## 2015-09-26 NOTE — Progress Notes (Addendum)
eLink Physician-Brief Progress Note Patient Name: Alan Miranda DOB: 08-08-1934 MRN: [id number]   Date of Service  09/26/2015  HPI/Events of Note  Hyperkalemia - K+ = 6.3 and Hypotension - BP = 75/64  eICU Interventions  Will order: 1. Kayexalate 30 gm via tube now.  2. BMP at 12 midnight.  3. Bolus with 0.9 NaCl 1 liter IV over 1 hour now.      Intervention Category Major Interventions: Other:  Lenell AntuSommer,Chrissi Crow [place] 09/26/2015, 7:54 PM

## 2015-09-26 NOTE — Progress Notes (Signed)
Hypoglycemic Event  CBG: 45  Treatment: D50 IV 50 mL  Symptoms: None  Follow-up CBG: Time:1718 CBG Result:127   Possible Reasons for Event: Unknown  Comments/MD notified:Dr Viviana SimplerMcQuaid    Leovardo Thoman, Ilda BassetKristina F

## 2015-09-26 NOTE — Progress Notes (Addendum)
Critical ABG results called to Henreitta LeberP Hoffman, NP, CCM at 14:20.  PH 7.219 PaCO2 below reportable range PaO2 94.2 HCO3 4.1   RT will continue to monitor.

## 2015-09-26 NOTE — Progress Notes (Signed)
[facility] Progress Note  Subjective: No specific complaints today. CT scan shows a large mass in the liver.  Objective: Vital signs in last 24 hours: Temp:  [96.2 F (35.7 C)-97.7 F (36.5 C)] 97.3 F (36.3 C) (09/19 0800) Pulse Rate:  [40-94] 88 (09/19 0800) Resp:  [13-25] 24 (09/19 0800) BP: (110-165)/(67-99) 137/89 (09/19 0800) SpO2:  [94 %-100 %] 96 % (09/19 0800) FiO2 (%):  [0 %] 0 % (09/18 2130) Weight:  [86.2 kg (190 lb)-96.1 kg (211 lb 13.8 oz)] 96.1 kg (211 lb 13.8 oz) (09/19 0624) Weight change:    PE:  No distress  Heart regular rhythm  Lungs clear  Abdomen soft and nontender  Lab Results: Results for orders placed or performed during the hospital encounter of 09/29/2015 (from the past 24 hour(s))  Comprehensive metabolic panel     Status: Abnormal   Collection Time: 10/07/2015 12:40 PM  Result Value Ref Range   Sodium 132 (L) 135 - 145 mmol/L   Potassium 5.3 (H) 3.5 - 5.1 mmol/L   Chloride 95 (L) 101 - 111 mmol/L   CO2 15 (L) 22 - 32 mmol/L   Glucose, Bld 143 (H) 65 - 99 mg/dL   BUN 49 (H) 6 - 20 mg/dL   Creatinine, Ser 1.612.04 (H) 0.61 - 1.24 mg/dL   Calcium 9.1 8.9 - 09.610.3 mg/dL   Total Protein 6.5 6.5 - 8.1 g/dL   Albumin 3.6 3.5 - 5.0 g/dL   AST 0,4543,645 (H) 15 - 41 U/L   ALT 2,910 (H) 17 - 63 U/L   Alkaline Phosphatase 298 (H) 38 - 126 U/L   Total Bilirubin 4.5 (H) 0.3 - 1.2 mg/dL   GFR calc non Af Amer 30 (L) >60 mL/min   GFR calc Af Amer 34 (L) >60 mL/min   Anion gap 22 (H) 5 - 15  CBC WITH DIFFERENTIAL     Status: Abnormal   Collection Time: 10/06/2015 12:40 PM  Result Value Ref Range   WBC 18.1 (H) 4.0 - 10.5 K/uL   RBC 5.73 4.22 - 5.81 MIL/uL   Hemoglobin 16.2 13.0 - 17.0 g/dL   HCT 09.850.4 11.939.0 - 14.752.0 %   MCV 88.0 78.0 - 100.0 fL   MCH 28.3 26.0 - 34.0 pg   MCHC 32.1 30.0 - 36.0 g/dL   RDW 82.915.1 56.211.5 - 13.015.5 %   Platelets 160 150 - 400 K/uL   Neutrophils Relative % 80 %   Neutro Abs 14.3 (H) 1.7 - 7.7 K/uL   Lymphocytes Relative 10 %    Lymphs Abs 1.9 0.7 - 4.0 K/uL   Monocytes Relative 10 %   Monocytes Absolute 1.9 (H) 0.1 - 1.0 K/uL   Eosinophils Relative 0 %   Eosinophils Absolute 0.0 0.0 - 0.7 K/uL   Basophils Relative 0 %   Basophils Absolute 0.0 0.0 - 0.1 K/uL  Troponin I     Status: Abnormal   Collection Time: 09/18/2015 12:40 PM  Result Value Ref Range   Troponin I 0.05 (HH) <0.03 ng/mL  Brain natriuretic peptide     Status: None   Collection Time: 09/15/2015 12:40 PM  Result Value Ref Range   B Natriuretic Peptide 53.9 0.0 - 100.0 pg/mL  Lipase, blood     Status: Abnormal   Collection Time: 09/17/2015 12:40 PM  Result Value Ref Range   Lipase 64 (H) 11 - 51 U/L  TSH     Status: None   Collection Time: 10/03/2015 12:40 PM  Result  Value Ref Range   TSH 2.588 0.350 - 4.500 uIU/mL  I-Stat CG4 Lactic Acid, ED  (not at  [facility])     Status: Abnormal   Collection Time: 09/08/2015 12:54 PM  Result Value Ref Range   Lactic Acid, Venous 13.14 (HH) 0.5 - 1.9 mmol/L   Comment NOTIFIED PHYSICIAN   Protime-INR     Status: Abnormal   Collection Time: 09/20/2015  1:03 PM  Result Value Ref Range   Prothrombin Time 33.3 (H) 11.4 - 15.2 seconds   INR 3.18   I-Stat CG4 Lactic Acid, ED  (not at  [facility])     Status: Abnormal   Collection Time: 10/06/2015  3:35 PM  Result Value Ref Range   Lactic Acid, Venous 11.19 (HH) 0.5 - 1.9 mmol/L   Comment NOTIFIED PHYSICIAN   Lactic acid, plasma     Status: Abnormal   Collection Time: 09/08/2015  5:03 PM  Result Value Ref Range   Lactic Acid, Venous 9.1 (HH) 0.5 - 1.9 mmol/L  Procalcitonin     Status: None   Collection Time: 09/10/2015  5:03 PM  Result Value Ref Range   Procalcitonin 1.05 ng/mL  APTT     Status: Abnormal   Collection Time: 10/01/2015  5:03 PM  Result Value Ref Range   aPTT 40 (H) 24 - 36 seconds  Lactic acid, plasma     Status: Abnormal   Collection Time: 09/15/2015  8:05 PM  Result Value Ref Range   Lactic Acid, Venous 8.9 (HH) 0.5 - 1.9 mmol/L  Urinalysis, Routine w reflex  microscopic (not at [facility])     Status: Abnormal   Collection Time: 09/14/2015  8:18 PM  Result Value Ref Range   Color, Urine AMBER (A) YELLOW   APPearance CLOUDY (A) CLEAR   Specific Gravity, Urine 1.029 1.005 - 1.030   pH 5.0 5.0 - 8.0   Glucose, UA NEGATIVE NEGATIVE mg/dL   Hgb urine dipstick NEGATIVE NEGATIVE   Bilirubin Urine SMALL (A) NEGATIVE   Ketones, ur NEGATIVE NEGATIVE mg/dL   Protein, ur 30 (A) NEGATIVE mg/dL   Nitrite NEGATIVE NEGATIVE   Leukocytes, UA NEGATIVE NEGATIVE  Urine microscopic-add on     Status: Abnormal   Collection Time: 09/22/2015  8:18 PM  Result Value Ref Range   Squamous Epithelial / LPF 0-5 (A) NONE SEEN   WBC, UA 0-5 0 - 5 WBC/hpf   RBC / HPF NONE SEEN 0 - 5 RBC/hpf   Bacteria, UA NONE SEEN NONE SEEN   Casts HYALINE CASTS (A) NEGATIVE  CBG monitoring, ED     Status: Abnormal   Collection Time: 09/28/2015  9:34 PM  Result Value Ref Range   Glucose-Capillary 128 (H) 65 - 99 mg/dL   Comment 1 Notify RN    Comment 2 Document in Chart   MRSA PCR Screening     Status: None   Collection Time: 09/20/2015 11:31 PM  Result Value Ref Range   MRSA by PCR NEGATIVE NEGATIVE  Glucose, capillary     Status: Abnormal   Collection Time: 10/02/2015 11:40 PM  Result Value Ref Range   Glucose-Capillary 114 (H) 65 - 99 mg/dL   Comment 1 Notify RN   Lactic acid, plasma     Status: Abnormal   Collection Time: 09/26/15 12:40 AM  Result Value Ref Range   Lactic Acid, Venous 9.4 (HH) 0.5 - 1.9 mmol/L  Basic metabolic panel     Status: Abnormal   Collection Time: 09/26/15  3:06  AM  Result Value Ref Range   Sodium 131 (L) 135 - 145 mmol/L   Potassium 5.1 3.5 - 5.1 mmol/L   Chloride 100 (L) 101 - 111 mmol/L   CO2 11 (L) 22 - 32 mmol/L   Glucose, Bld 111 (H) 65 - 99 mg/dL   BUN 55 (H) 6 - 20 mg/dL   Creatinine, Ser 9.14 (H) 0.61 - 1.24 mg/dL   Calcium 7.9 (L) 8.9 - 10.3 mg/dL   GFR calc non Af Amer 31 (L) >60 mL/min   GFR calc Af Amer 36 (L) >60 mL/min   Anion gap 20 (H)  5 - 15  CBC     Status: Abnormal   Collection Time: 09/26/15  3:06 AM  Result Value Ref Range   WBC 16.3 (H) 4.0 - 10.5 K/uL   RBC 5.15 4.22 - 5.81 MIL/uL   Hemoglobin 14.2 13.0 - 17.0 g/dL   HCT 78.2 95.6 - 21.3 %   MCV 86.4 78.0 - 100.0 fL   MCH 27.6 26.0 - 34.0 pg   MCHC 31.9 30.0 - 36.0 g/dL   RDW 08.6 57.8 - 46.9 %   Platelets 109 (L) 150 - 400 K/uL  Lactic acid, plasma     Status: Abnormal   Collection Time: 09/26/15  3:06 AM  Result Value Ref Range   Lactic Acid, Venous 9.7 (HH) 0.5 - 1.9 mmol/L  Glucose, capillary     Status: None   Collection Time: 09/26/15  3:38 AM  Result Value Ref Range   Glucose-Capillary 99 65 - 99 mg/dL  Glucose, capillary     Status: None   Collection Time: 09/26/15  6:20 AM  Result Value Ref Range   Glucose-Capillary 89 65 - 99 mg/dL  Hepatic function panel     Status: Abnormal   Collection Time: 09/26/15  6:38 AM  Result Value Ref Range   Total Protein 5.5 (L) 6.5 - 8.1 g/dL   Albumin 3.1 (L) 3.5 - 5.0 g/dL   AST 6,295 (H) 15 - 41 U/L   ALT 3,040 (H) 17 - 63 U/L   Alkaline Phosphatase 307 (H) 38 - 126 U/L   Total Bilirubin 5.1 (H) 0.3 - 1.2 mg/dL   Bilirubin, Direct 2.4 (H) 0.1 - 0.5 mg/dL   Indirect Bilirubin 2.7 (H) 0.3 - 0.9 mg/dL    Studies/Results: Ct Abdomen Pelvis Wo Contrast  Result Date: 2015/10/20 CLINICAL DATA:  Evaluate for liver abnormality.  Sepsis. EXAM: CT CHEST, ABDOMEN AND PELVIS WITHOUT CONTRAST TECHNIQUE: Multidetector CT imaging of the chest, abdomen and pelvis was performed following the standard protocol without IV contrast. COMPARISON:  Abdominal ultrasound 4 days ago FINDINGS: CT CHEST FINDINGS Cardiovascular: Normal heart size. Small low-density pericardial effusion. Fibro fatty scarring in the anterior wall and septum of the left ventricle. Extensive coronary atherosclerotic calcification. No evidence of acute vascular disease. Mediastinum:  Negative for adenopathy.  Small hiatal hernia. Lungs/Pleura: Motion  degraded. Small layering right pleural effusion that is water density. Trace left pleural effusion. There is no edema, consolidation, or pneumothorax. Musculoskeletal: Anasarca.  No acute or aggressive finding. CT ABDOMEN PELVIS FINDINGS Hepatobiliary: Heterogeneous caudate lobe mass imaging of the 10 cm on reformats. No background cirrhosis is noted. The liver appears generally steatotic. Few other hepatic low densities are likely cystic. The mass displaces at least the main portal vein which shows normal density and no expansion. Cholelithiasis. There is mild edema around the gallbladder, similar to elsewhere, without suspect wall thickening over distension. No common  bile duct dilatation or signs of choledocholithiasis Pancreas: Unremarkable. Spleen: Unremarkable. Adrenals/Urinary Tract: Negative adrenals. No hydronephrosis or stone. Exophytic right renal cyst as confirmed on previous ultrasound. 2 cm parenchymal low-density area in the right kidney is also cystic density. Unremarkable bladder. Stomach/Bowel: No obstruction. No inflammatory changes. Mild colonic diverticulosis. Vascular/Lymphatic: No acute vascular finding. Retroperitoneal edema is likely from volume overload. Atherosclerotic calcification of the aorta and branch vessels. No visualized adenopathy. Reproductive:No pathologic findings. Other: Small non loculated ascites. Probable previous inguinal hernia repairs. Musculoskeletal: No acute or aggressive finding.  Anasarca. 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 Canal stenosis that is greatest at L3-4. IMPRESSION: 1. 10 cm liver mass at the caudate. When renal function permits, recommend enhanced liver MRI. 2. Cholelithiasis without suspected cholecystitis. 3. Volume overload with anasarca, small pleural effusions, and small ascites. 4. Remote left ventricular infarct. Electronically Signed   By: Marnee Spring M.D.   On: 09/15/2015 17:25   Dg Chest 2  View  Result Date: 10/02/2015 CLINICAL DATA:  Generalized weakness and shortness of Breath EXAM: CHEST  2 VIEW COMPARISON:  07/07/2009 FINDINGS: Cardiac shadow is within normal limits. The lungs are poorly aerated although no focal infiltrate is seen. Small bilateral pleural effusions are noted. No bony abnormality noted. IMPRESSION: Bilateral pleural effusions Electronically Signed   By: Alcide Clever M.D.   On: 09/26/2015 14:55   Ct Chest Wo Contrast  Result Date: 10/02/2015 CLINICAL DATA:  Evaluate for liver abnormality.  Sepsis. EXAM: CT CHEST, ABDOMEN AND PELVIS WITHOUT CONTRAST TECHNIQUE: Multidetector CT imaging of the chest, abdomen and pelvis was performed following the standard protocol without IV contrast. COMPARISON:  Abdominal ultrasound 4 days ago FINDINGS: CT CHEST FINDINGS Cardiovascular: Normal heart size. Small low-density pericardial effusion. Fibro fatty scarring in the anterior wall and septum of the left ventricle. Extensive coronary atherosclerotic calcification. No evidence of acute vascular disease. Mediastinum:  Negative for adenopathy.  Small hiatal hernia. Lungs/Pleura: Motion degraded. Small layering right pleural effusion that is water density. Trace left pleural effusion. There is no edema, consolidation, or pneumothorax. Musculoskeletal: Anasarca.  No acute or aggressive finding. CT ABDOMEN PELVIS FINDINGS Hepatobiliary: Heterogeneous caudate lobe mass imaging of the 10 cm on reformats. No background cirrhosis is noted. The liver appears generally steatotic. Few other hepatic low densities are likely cystic. The mass displaces at least the main portal vein which shows normal density and no expansion. Cholelithiasis. There is mild edema around the gallbladder, similar to elsewhere, without suspect wall thickening over distension. No common bile duct dilatation or signs of choledocholithiasis Pancreas: Unremarkable. Spleen: Unremarkable. Adrenals/Urinary Tract: Negative adrenals.  No hydronephrosis or stone. Exophytic right renal cyst as confirmed on previous ultrasound. 2 cm parenchymal low-density area in the right kidney is also cystic density. Unremarkable bladder. Stomach/Bowel: No obstruction. No inflammatory changes. Mild colonic diverticulosis. Vascular/Lymphatic: No acute vascular finding. Retroperitoneal edema is likely from volume overload. Atherosclerotic calcification of the aorta and branch vessels. No visualized adenopathy. Reproductive:No pathologic findings. Other: Small non loculated ascites. Probable previous inguinal hernia repairs. Musculoskeletal: No acute or aggressive finding.  Anasarca. 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 Canal stenosis that is greatest at L3-4. IMPRESSION: 1. 10 cm liver mass at the caudate. When renal function permits, recommend enhanced liver MRI. 2. Cholelithiasis without suspected cholecystitis. 3. Volume overload with anasarca, small pleural effusions, and small ascites. 4. Remote left ventricular infarct. Electronically Signed   By: Kathrynn Ducking.D.  On: 10/03/2015 17:25      Assessment: Large mass in the liver. Tumor markers pending  Plan:   Patient has been seen by surgery in regards to his gallbladder. Tumor markers are pending to see if anything shows up positive which might suggest hepatocellular carcinoma versus other malignancies. Continue antibiotics for now.    SAM F Zoiey Christy 09/26/2015, 10:10 AM  Pager: [phone] If no answer or after 5 PM call [phone]

## 2015-09-26 NOTE — Progress Notes (Addendum)
[facility]  PROGRESS NOTE  Alan Miranda UYQ:[id number] DOB: 21-Apr-1934 DOA: 03/10/2015 PCP: Lillia MountainGRIFFIN,JOHN JOSEPH, MD   Brief HPI:  *81 year old male who came to the emergency room today because of extreme fatigue. He was so tired and fatigued that he could not walk more than a few status. He had a recent abdominal ultrasound which showed gallstones. There were also findings in the liver of multiple foci of abnormalities which could represent metastatic disease. In the emergency room he was found to have a white blood cell count of 18,100. Lactic acid was very high. Prothrombin time 33.3. INR 3.18. Patient has not been on Coumadin. Patient denies any known liver disease. He denies drinking alcohol. AST 3645, ALT 2910, total bilirubin 4.5. Patient denies right upper quadrant abdominal pain or any abdominal pain. CT abdomen and pelvis was obtained which showed a 10 cm liver mass at the caudate. Cholelithiasis without suspected cholecystitis.  .   Subjective    This morning patient complains of generalized weakness, also complains of poor appetite for the past few weeks. Also complains of dyspnea on exertion.    Assessment/Plan:     1. Sepsis- unclear source at this time, CT scan abdomen showed cholelithiasis without cholecystitis. UA was clear, chest x-ray showed bilateral pleural effusions. No pneumonia. Patient only complains of generalized weakness. Lactic acid was still elevated 9.7 this morning. Blood cultures 2 are pending. Continue empiric antibiotics vancomycin and Zosyn per pharmacy consultation. 2. Liver mass with transaminitis- CT abdomen and pelvis showed 10 cm liver mass in the caudate lobe of the liver. GI has been consulted, hepatitis panel and CA 19-9, AFP, CEA have been ordered. We will follow the results. Discontinue Vicodin. 3. Acute kidney injury- slowly improving creatinine 1.97. Likely from dehydration and poor by mouth intake. 4. History of CAD, chronic systolic and  diastolic CHF- echo showed EF 35%, denies chest pain but complains of dyspnea on exertion. He was started on IV fluids for sepsis, systolic blood pressure has been stable in 150s. Will change the IV fluids to [facility] to prevent fluid overload/pulmonary edema. 5. Dyslipidemia- statins on hold due to transaminitis 6. Diabetes mellitus type 2- continue sliding scale insulin with NovoLog. 7. Thrombocytopenia- platelet count is 109 today, will discontinue heparin. Patient auto anticoagulated with INR 3.18     DVT prophylaxis: On heparin, INR is 3.18. Will discontinue heparin as directed count is 109 today  Code Status: Full code  Family Communication: No family present at bedside   Disposition Plan: To be decided   Consultants:  Gen. surgery  Gastroenterology  Procedures:  None  Antibiotics:   Anti-infectives    Start     Dose/Rate Route Frequency Ordered Stop   04-09-2015 2000  piperacillin-tazobactam (ZOSYN) IVPB 3.375 g     3.375 g 12.5 mL/hr over 240 Minutes Intravenous Every 8 hours 04-09-2015 1653     04-09-2015 1315  vancomycin (VANCOCIN) IVPB 1000 mg/200 mL premix     1,000 mg 200 mL/hr over 60 Minutes Intravenous  Once 04-09-2015 1302 04-09-2015 1458   04-09-2015 1315  piperacillin-tazobactam (ZOSYN) IVPB 3.375 g     3.375 g 100 mL/hr over 30 Minutes Intravenous  Once 04-09-2015 1302 04-09-2015 1423   04-09-2015 0000  piperacillin-tazobactam (ZOSYN) IVPB 3.375 g  Status:  Discontinued     3.375 g 12.5 mL/hr over 240 Minutes Intravenous Every 8 hours 04-09-2015 1644 04-09-2015 1653       Objective   Vitals:   09/26/15 0314 09/26/15 0344  09/26/15 0624 09/26/15 0800  BP: (!) 122/91   137/89  Pulse: 81   88  Resp: 18   (!) 24  Temp: (!) 96.5 F (35.8 C) 97.6 F (36.4 C) 97.3 F (36.3 C) 97.3 F (36.3 C)  TempSrc: Axillary Rectal Axillary Axillary  SpO2: 99%   96%  Weight:   96.1 kg (211 lb 13.8 oz)   Height:        Intake/Output Summary (Last 24 hours) at 09/26/15 0943 Last  data filed at 09/26/15 0624  Gross per 24 hour  Intake          3721.25 ml  Output              150 ml  Net          3571.25 ml   Filed Weights   09/22/2015 1243 10/07/2015 2338 09/26/15 0624  Weight: 86.2 kg (190 lb) 94.2 kg (207 lb 10.8 oz) 96.1 kg (211 lb 13.8 oz)     Physical Examination:  General exam: Appears calm and comfortable. Respiratory system: Clear to auscultation. Mild increase in respiratory effort Cardiovascular system:  RRR. No  murmurs, rubs, gallops. Bilateral 1+ pitting edema of the lower extremities GI system: Abdomen is nondistended, soft and nontender. No organomegaly.  Central nervous system. No focal neurological deficits. 5 x 5 power in all extremities. Psychiatry: Alert, oriented x 3.Judgement and insight appear normal. Affect normal.    Data Reviewed: I have personally reviewed following labs and imaging studies  CBG:  Recent Labs Lab 09/08/2015 2134 09/26/2015 2340 09/26/15 0338 09/26/15 0620  GLUCAP 128* 114* 99 89    CBC:  Recent Labs Lab 09/24/2015 1240 09/26/15 0306  WBC 18.1* 16.3*  NEUTROABS 14.3*  --   HGB 16.2 14.2  HCT 50.4 44.5  MCV 88.0 86.4  PLT 160 109*    Basic Metabolic Panel:  Recent Labs Lab 09/21/2015 0747 09/16/2015 1240 09/26/15 0306  NA 131* 132* 131*  K 5.0 5.3* 5.1  CL 92* 95* 100*  CO2 19* 15* 11*  GLUCOSE 152* 143* 111*  BUN 47* 49* 55*  CREATININE 1.43* 2.04* 1.97*  CALCIUM 9.0 9.1 7.9*    Recent Results (from the past 240 hour(s))  MRSA PCR Screening     Status: None   Collection Time: 09/11/2015 11:31 PM  Result Value Ref Range Status   MRSA by PCR NEGATIVE NEGATIVE Final    Comment:        The GeneXpert MRSA Assay (FDA approved for NASAL specimens only), is one component of a comprehensive MRSA colonization surveillance program. It is not intended to diagnose MRSA infection nor to guide or monitor treatment for MRSA infections.      Liver Function Tests:  Recent Labs Lab 09/20/2015 1240  09/26/15 0638  AST 3,645* 4,419*  ALT 2,910* 3,040*  ALKPHOS 298* 307*  BILITOT 4.5* 5.1*  PROT 6.5 5.5*  ALBUMIN 3.6 3.1*    Recent Labs Lab 10/02/2015 1240  LIPASE 64*   No results for input(s): AMMONIA in the last 168 hours.  Cardiac Enzymes:  Recent Labs Lab 09/14/2015 1240  TROPONINI 0.05*   BNP (last 3 results)  Recent Labs  09/14/2015 1240  BNP 53.9    ProBNP (last 3 results) No results for input(s): PROBNP in the last 8760 hours.    Studies: Ct Abdomen Pelvis Wo Contrast  Result Date: 09/09/2015 CLINICAL DATA:  Evaluate for liver abnormality.  Sepsis. EXAM: CT CHEST, ABDOMEN AND PELVIS WITHOUT CONTRAST TECHNIQUE:  Multidetector CT imaging of the chest, abdomen and pelvis was performed following the standard protocol without IV contrast. COMPARISON:  Abdominal ultrasound 4 days ago FINDINGS: CT CHEST FINDINGS Cardiovascular: Normal heart size. Small low-density pericardial effusion. Fibro fatty scarring in the anterior wall and septum of the left ventricle. Extensive coronary atherosclerotic calcification. No evidence of acute vascular disease. Mediastinum:  Negative for adenopathy.  Small hiatal hernia. Lungs/Pleura: Motion degraded. Small layering right pleural effusion that is water density. Trace left pleural effusion. There is no edema, consolidation, or pneumothorax. Musculoskeletal: Anasarca.  No acute or aggressive finding. CT ABDOMEN PELVIS FINDINGS Hepatobiliary: Heterogeneous caudate lobe mass imaging of the 10 cm on reformats. No background cirrhosis is noted. The liver appears generally steatotic. Few other hepatic low densities are likely cystic. The mass displaces at least the main portal vein which shows normal density and no expansion. Cholelithiasis. There is mild edema around the gallbladder, similar to elsewhere, without suspect wall thickening over distension. No common bile duct dilatation or signs of choledocholithiasis Pancreas: Unremarkable. Spleen:  Unremarkable. Adrenals/Urinary Tract: Negative adrenals. No hydronephrosis or stone. Exophytic right renal cyst as confirmed on previous ultrasound. 2 cm parenchymal low-density area in the right kidney is also cystic density. Unremarkable bladder. Stomach/Bowel: No obstruction. No inflammatory changes. Mild colonic diverticulosis. Vascular/Lymphatic: No acute vascular finding. Retroperitoneal edema is likely from volume overload. Atherosclerotic calcification of the aorta and branch vessels. No visualized adenopathy. Reproductive:No pathologic findings. Other: Small non loculated ascites. Probable previous inguinal hernia repairs. Musculoskeletal: No acute or aggressive finding.  Anasarca. 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 Canal stenosis that is greatest at L3-4. IMPRESSION: 1. 10 cm liver mass at the caudate. When renal function permits, recommend enhanced liver MRI. 2. Cholelithiasis without suspected cholecystitis. 3. Volume overload with anasarca, small pleural effusions, and small ascites. 4. Remote left ventricular infarct. Electronically Signed   By: Marnee Spring M.D.   On: October 03, 2015 17:25   Dg Chest 2 View  Result Date: 10/03/15 CLINICAL DATA:  Generalized weakness and shortness of Breath EXAM: CHEST  2 VIEW COMPARISON:  07/07/2009 FINDINGS: Cardiac shadow is within normal limits. The lungs are poorly aerated although no focal infiltrate is seen. Small bilateral pleural effusions are noted. No bony abnormality noted. IMPRESSION: Bilateral pleural effusions Electronically Signed   By: Alcide Clever M.D.   On: 10-03-15 14:55   Ct Chest Wo Contrast  Result Date: Oct 03, 2015 CLINICAL DATA:  Evaluate for liver abnormality.  Sepsis. EXAM: CT CHEST, ABDOMEN AND PELVIS WITHOUT CONTRAST TECHNIQUE: Multidetector CT imaging of the chest, abdomen and pelvis was performed following the standard protocol without IV contrast. COMPARISON:  Abdominal ultrasound 4  days ago FINDINGS: CT CHEST FINDINGS Cardiovascular: Normal heart size. Small low-density pericardial effusion. Fibro fatty scarring in the anterior wall and septum of the left ventricle. Extensive coronary atherosclerotic calcification. No evidence of acute vascular disease. Mediastinum:  Negative for adenopathy.  Small hiatal hernia. Lungs/Pleura: Motion degraded. Small layering right pleural effusion that is water density. Trace left pleural effusion. There is no edema, consolidation, or pneumothorax. Musculoskeletal: Anasarca.  No acute or aggressive finding. CT ABDOMEN PELVIS FINDINGS Hepatobiliary: Heterogeneous caudate lobe mass imaging of the 10 cm on reformats. No background cirrhosis is noted. The liver appears generally steatotic. Few other hepatic low densities are likely cystic. The mass displaces at least the main portal vein which shows normal density and no expansion. Cholelithiasis. There is mild edema around the gallbladder, similar to elsewhere, without  suspect wall thickening over distension. No common bile duct dilatation or signs of choledocholithiasis Pancreas: Unremarkable. Spleen: Unremarkable. Adrenals/Urinary Tract: Negative adrenals. No hydronephrosis or stone. Exophytic right renal cyst as confirmed on previous ultrasound. 2 cm parenchymal low-density area in the right kidney is also cystic density. Unremarkable bladder. Stomach/Bowel: No obstruction. No inflammatory changes. Mild colonic diverticulosis. Vascular/Lymphatic: No acute vascular finding. Retroperitoneal edema is likely from volume overload. Atherosclerotic calcification of the aorta and branch vessels. No visualized adenopathy. Reproductive:No pathologic findings. Other: Small non loculated ascites. Probable previous inguinal hernia repairs. Musculoskeletal: No acute or aggressive finding.  Anasarca. 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 Canal stenosis that is greatest at L3-4.  IMPRESSION: 1. 10 cm liver mass at the caudate. When renal function permits, recommend enhanced liver MRI. 2. Cholelithiasis without suspected cholecystitis. 3. Volume overload with anasarca, small pleural effusions, and small ascites. 4. Remote left ventricular infarct. Electronically Signed   By: Marnee Spring M.D.   On: 10/03/2015 17:25    Scheduled Meds: . heparin  5,000 Units Subcutaneous Q8H  . insulin aspart  0-9 Units Subcutaneous Q6H  . pantoprazole  40 mg Oral Daily  . piperacillin-tazobactam (ZOSYN)  IV  3.375 g Intravenous Q8H  . sodium chloride flush  3 mL Intravenous Q12H    Continuous Infusions: . sodium chloride 75 mL/hr at 10/05/2015 2351    Time spent: 25 min  [facility]   [facility] Pager [phone]. If 7PM-7AM, please contact night-coverage at [email or web address], Office  [phone]  password [email or web address] 09/26/2015, 9:43 AM  LOS: 1 day

## 2015-09-26 NOTE — Progress Notes (Signed)
CRITICAL VALUE ALERT  Critical value received:  Potassium 6.3  Date of notification:  09/26/15  Time of notification:  1938  Critical value read back:Yes.    Nurse who received alert:  Judeth CornfieldStephanie  MD notified (1st page):  Sommers  Time of first page:  1936  MD notified (2nd page):  Time of second page:  Responding MD:  Dellie CatholicSommers  Time MD responded:  [phone]

## 2015-09-26 NOTE — Procedures (Signed)
Intubation Procedure Note Alan Miranda [id number] 1934-07-10  Procedure: Intubation Indications: Airway protection and maintenance  Procedure Details Consent: Risks of procedure as well as the alternatives and risks of each were explained to the (patient/caregiver).  Consent for procedure obtained. Time Out: Verified patient identification, verified procedure, site/side was marked, verified correct patient position, special equipment/implants available, medications/allergies/relevent history reviewed, required imaging and test results available.  Performed  Drugs Etomidate 20mg  IV, Rocuronium 80mg  IV DL x 1 with GS 3 blade Grade 1 view 7.5 ET tube passed through cords under direct visualization Placement confirmed with bilateral breath sounds, positive EtCO2 change and smoke in tube   Evaluation Hemodynamic Status: BP stable throughout; O2 sats: stable throughout Patient's Current Condition: stable Complications: No apparent complications Patient did tolerate procedure well. Chest X-ray ordered to verify placement.  CXR: pending.   Alan FickleDouglas Alan Miranda 09/26/2015

## 2015-09-26 NOTE — Pre-Procedure Instructions (Signed)
I was notified this evening by the team on the ground that [name]'s labs were worsening, specifically his lactic acid and his kidney function.  He is not in shock.   [facility] and they stated that he is not a liver transplant candidate.  They recommended that we administer n-acetylcystine and they did not recommend dialysis.   After this conversation I discussed the situation with the patient's wife i n detail.  I explained there is little we have to offer and that he will likely die tonight.  I recommended that we change his code status to DNR and she agreed.  Bedside nurse updated in detail.

## 2015-09-26 NOTE — Progress Notes (Signed)
CRITICAL VALUE ALERT  Critical value received:  Lactic acid 10.2  Date of notification:  09/26/15  Time of notification:  10:10  Critical value read back:yes  Nurse who received alert:  Cala BradfordMaggie Rand, RN  MD notified (1st page):  [place]  Time of first page:  10:11

## 2015-09-26 NOTE — Progress Notes (Signed)
CRITICAL VALUE ALERT  Critical value received:  Lactic Acid 9.7  Date of notification:  09/26/15   Time of notification:  0415  Critical value read back: yes  Nurse who received alert:  Kizzie BaneAmy Shanayah Kaffenberger, RN  MD notified (1st page):  Craige CottaKirby  Time of first page: 0425  Responding MD:  Craige CottaKirby  Time MD responded:  [phone]

## 2015-09-27 ENCOUNTER — Other Ambulatory Visit: Payer: Self-pay

## 2015-09-27 LAB — CANCER ANTIGEN 19-9: CA 19-9: 23 U/mL (ref 0–35)

## 2015-09-27 LAB — URINE CULTURE: CULTURE: NO GROWTH

## 2015-09-27 LAB — HEPATITIS PANEL, ACUTE
HEP B C IGM: NEGATIVE
HEP B S AG: NEGATIVE
Hep A IgM: NEGATIVE

## 2015-09-27 LAB — CEA: CEA: 0.9 ng/mL (ref 0.0–4.7)

## 2015-09-27 LAB — AFP TUMOR MARKER: AFP TUMOR MARKER: 794.9 ng/mL — AB (ref 0.0–8.3)

## 2015-09-29 ENCOUNTER — Other Ambulatory Visit: Payer: 59

## 2015-09-30 LAB — CULTURE, BLOOD (ROUTINE X 2)
CULTURE: NO GROWTH
CULTURE: NO GROWTH

## 2015-10-02 ENCOUNTER — Telehealth: Payer: Self-pay

## 2015-10-02 LAB — CULTURE, BLOOD (ROUTINE X 2): CULTURE: NO GROWTH

## 2015-10-02 NOTE — Telephone Encounter (Signed)
On 10/02/2015 I [facility] ([facility] The death certificate is for burial. The patient is a patient of Doctor McQuaid. The death certificate will be taken to Pulmonary Unit @ [facility] for signature. On 10/03/2015 I received the death certificate back from Doctor McQuaid. I got the death certificate ready and called the funeral home to let them know the death certificate is ready for pickup.

## 2015-10-08 NOTE — Progress Notes (Signed)
Pts wedding band removed and given to patients wife

## 2015-10-08 NOTE — Progress Notes (Signed)
Put ventilator on stand by pt is deceased and being pronounced at this time.  Verified with RN

## 2015-10-08 NOTE — Discharge Summary (Signed)
[facility] pulmonary and critical care note  Date of death 10/01/2015  Cause of death: #1 acute hepatic failure #2 hepatic mass #3 acute kidney injury #4 acute encephalopathy #5 metabolic acidosis  History of present illness and hospital course: This is a 81 year old gentleman with baseline diabetes, hyperlipidemia, and hypertension who has been very active in his community who was admitted to our hospital on 05/29/15. He was found to have evidence of acute liver injury, acute kidney injury. He was admitted to the medicine service and gastroenterology was consulted. CT scan was performed which showed evidence of a large mass in the hilum of the liver. Not long after's hospitalization his mental status deteriorated, his respiratory rate increased, and his kidney function worsens despite IV fluids. Pulmonary critical care medicine was consulted. He required intubation for airway control in the setting of encephalopathy and respiratory failure. His case was discussed with [facility] team who stated that because of his age in the size of the massive would not be a candidate for liver transplantation. We discussed this with the patient's family and they elected at that point to not escalate care further in the ICU setting. He died with his family at the bedside. Date of death 09/16/2015.  Heber CarolinaBrent McQuaid, MD [place] [facility] Pager: [phone] Cell: [phone] After 3pm or if no response, call [phone]

## 2015-10-08 NOTE — Procedures (Signed)
Extubation Procedure Note  Patient Details:   Name: Alan Miranda DOB: [birth year] MRN: [id number]   Airway Documentation:  Airway 7.5 mm (Active)  Secured at (cm) 23 cm 09/26/2015 11:33 PM  Measured From Lips 09/26/2015 11:33 PM  Secured Location Right 09/26/2015 11:33 PM  Secured By [employer] Tube Holder 09/26/2015 11:33 PM  Tube Holder Repositioned Yes 09/26/2015 11:33 PM  Site Condition Dry 09/26/2015 11:33 PM    Evaluation  O2 sats: unreadable, patient is decease Complications: No apparent complications Patient did tolerate procedure well. Bilateral Breath Sounds: Clear, Diminished   No  Pt extubated per family request.   Benjamine SpragueMaurice L Abryana Lykens, BS, RRT, RCP 2015/05/14, 12:35 AM

## 2015-10-08 NOTE — Progress Notes (Signed)
Pt went asystole and expired at 0014.  Pt was a full DNR and was pronounced by Judeth CornfieldStephanie, Charity fundraiserN and Victorino DikeJennifer, Charity fundraiserN. No heart or breath sounds present.  Family was present during patients expiration. Family approved removal of lines and tubes for patient to go to the morgue.  After removing tubes and lines, family reentered patients room to visit with family once more before taking him to the morgue.  As family was leaving the patients room for a final time, the patients daughter questioned having an autopsy performed.  Charge [facility] who directed this Clinical research associatewriter to contact [employer] Life[employer]t Hillard, pathologist.  Pathologist stated that we were not to contact him in the middle of the night for this matter and that it was not protocol.  Charge [facility] who directed us to take the patient to the morgue and the decision for an autopsy would be made in the morning.

## 2015-10-08 DEATH — deceased

## 2018-04-19 IMAGING — CR DG CHEST 1V PORT
1 series · 1 of 1 positions shown · non-contrast
Comparison: Chest radiograph September 25, 2015; chest CT September 25, 2015

CLINICAL DATA: Shortness of Breath

EXAM:
PORTABLE CHEST 1 VIEW

[AP]
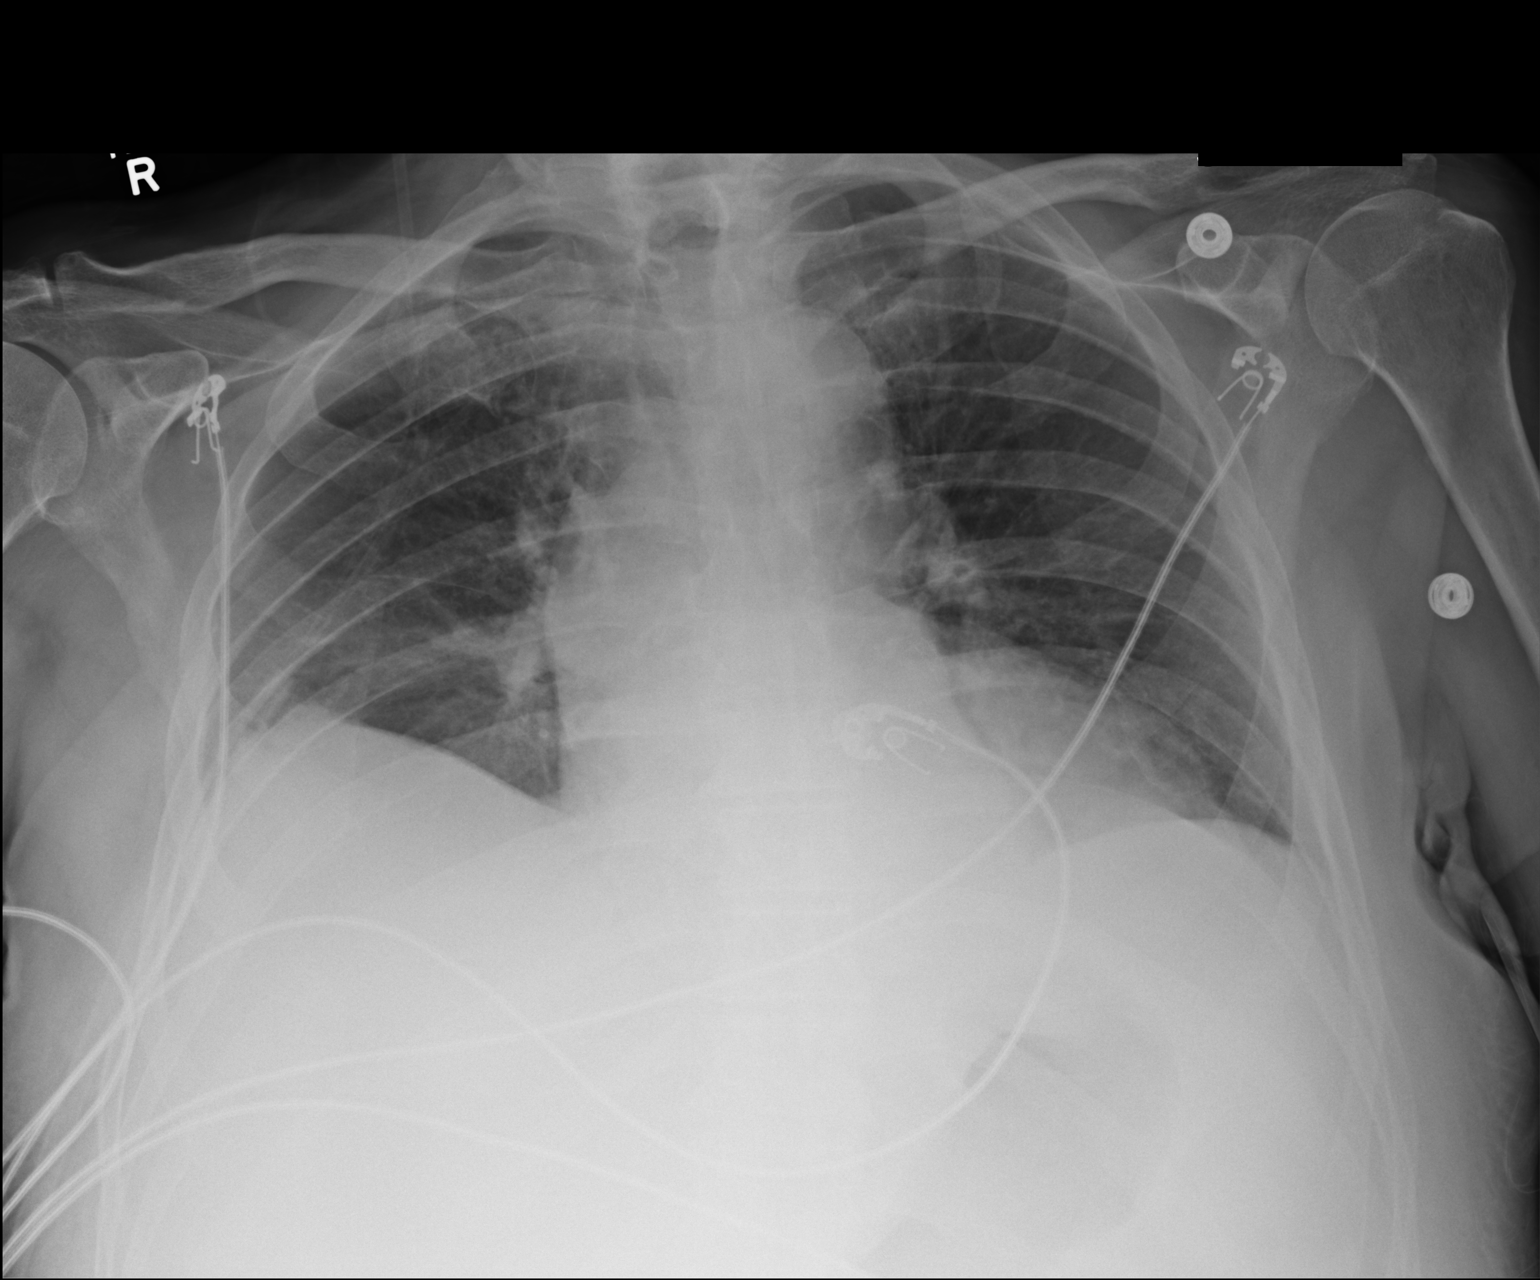

[1 of 1 positions shown; findings below may reference images not displayed]

FINDINGS: There is a right pleural effusion which has a subpulmonic component.
There is subtle patchy opacity in the left base behind the left
heart. Lungs elsewhere clear. Heart size and pulmonary vascularity
are normal. No adenopathy. No bone lesions.
IMPRESSION: Subpulmonic appearing pleural effusion on the right. Patchy opacity
behind the left heart, concerning for early pneumonia left base.
Lungs elsewhere clear. Stable cardiac silhouette.
